# Patient Record
Sex: Female | Born: 1965 | Race: White | Hispanic: No | Marital: Single | Smoking: Current every day smoker
Health system: Southern US, Community
[De-identification: ages and names within clinical notes are randomized; demographics above are authoritative.]

## PROBLEM LIST (undated history)

## (undated) DIAGNOSIS — I509 Heart failure, unspecified: Secondary | ICD-10-CM

## (undated) HISTORY — PX: ABDOMINAL HYSTERECTOMY: SHX81

---

## 2002-05-20 ENCOUNTER — Emergency Department (HOSPITAL_COMMUNITY): Admission: EM | Admit: 2002-05-20 | Discharge: 2002-05-20 | Payer: Self-pay | Admitting: Emergency Medicine

## 2005-01-01 ENCOUNTER — Ambulatory Visit: Payer: Self-pay | Admitting: Family Medicine

## 2005-07-04 ENCOUNTER — Emergency Department: Payer: Self-pay | Admitting: Emergency Medicine

## 2005-10-03 ENCOUNTER — Emergency Department: Payer: Self-pay | Admitting: Emergency Medicine

## 2006-03-28 ENCOUNTER — Emergency Department: Payer: Self-pay | Admitting: Unknown Physician Specialty

## 2007-01-03 ENCOUNTER — Emergency Department: Payer: Self-pay | Admitting: Emergency Medicine

## 2007-02-01 ENCOUNTER — Emergency Department: Payer: Self-pay

## 2007-05-31 ENCOUNTER — Emergency Department: Payer: Self-pay | Admitting: Emergency Medicine

## 2007-07-14 ENCOUNTER — Emergency Department: Payer: Self-pay | Admitting: Emergency Medicine

## 2007-07-21 ENCOUNTER — Ambulatory Visit: Payer: Self-pay | Admitting: Family Medicine

## 2007-08-25 ENCOUNTER — Emergency Department: Payer: Self-pay | Admitting: Emergency Medicine

## 2008-02-16 ENCOUNTER — Emergency Department: Payer: Self-pay | Admitting: Emergency Medicine

## 2008-02-24 ENCOUNTER — Emergency Department (HOSPITAL_COMMUNITY): Admission: EM | Admit: 2008-02-24 | Discharge: 2008-02-24 | Payer: Self-pay | Admitting: Emergency Medicine

## 2008-08-31 ENCOUNTER — Emergency Department (HOSPITAL_COMMUNITY): Admission: EM | Admit: 2008-08-31 | Discharge: 2008-09-01 | Payer: Self-pay | Admitting: Emergency Medicine

## 2008-11-17 ENCOUNTER — Emergency Department (HOSPITAL_COMMUNITY): Admission: EM | Admit: 2008-11-17 | Discharge: 2008-11-17 | Payer: Self-pay | Admitting: Emergency Medicine

## 2008-12-24 ENCOUNTER — Inpatient Hospital Stay: Payer: Self-pay | Admitting: *Deleted

## 2009-01-01 ENCOUNTER — Inpatient Hospital Stay: Payer: Self-pay | Admitting: Psychiatry

## 2009-07-20 ENCOUNTER — Emergency Department: Payer: Self-pay | Admitting: Emergency Medicine

## 2009-07-23 ENCOUNTER — Emergency Department: Payer: Self-pay | Admitting: Emergency Medicine

## 2009-09-15 ENCOUNTER — Encounter
Admission: RE | Admit: 2009-09-15 | Discharge: 2009-09-15 | Payer: Self-pay | Admitting: Physical Medicine & Rehabilitation

## 2010-05-19 ENCOUNTER — Emergency Department: Payer: Self-pay | Admitting: Emergency Medicine

## 2010-07-22 ENCOUNTER — Emergency Department: Payer: Self-pay | Admitting: Emergency Medicine

## 2010-07-22 ENCOUNTER — Inpatient Hospital Stay (HOSPITAL_COMMUNITY)
Admission: AD | Admit: 2010-07-22 | Discharge: 2010-07-28 | DRG: 194 | Disposition: A | Discharge status: Home / Self Care | Payer: Medicaid Other | Source: Other Acute Inpatient Hospital | Attending: Internal Medicine | Admitting: Internal Medicine

## 2010-07-22 DIAGNOSIS — IMO0002 Reserved for concepts with insufficient information to code with codable children: Secondary | ICD-10-CM

## 2010-07-22 DIAGNOSIS — J189 Pneumonia, unspecified organism: Principal | ICD-10-CM | POA: Diagnosis present

## 2010-07-22 DIAGNOSIS — I509 Heart failure, unspecified: Secondary | ICD-10-CM | POA: Diagnosis present

## 2010-07-22 DIAGNOSIS — I10 Essential (primary) hypertension: Secondary | ICD-10-CM | POA: Diagnosis present

## 2010-07-22 DIAGNOSIS — R0602 Shortness of breath: Secondary | ICD-10-CM

## 2010-07-22 DIAGNOSIS — R079 Chest pain, unspecified: Secondary | ICD-10-CM

## 2010-07-22 DIAGNOSIS — B1920 Unspecified viral hepatitis C without hepatic coma: Secondary | ICD-10-CM | POA: Diagnosis present

## 2010-07-22 DIAGNOSIS — Z880 Allergy status to penicillin: Secondary | ICD-10-CM

## 2010-07-22 DIAGNOSIS — R0902 Hypoxemia: Secondary | ICD-10-CM | POA: Diagnosis present

## 2010-07-22 DIAGNOSIS — F411 Generalized anxiety disorder: Secondary | ICD-10-CM | POA: Diagnosis present

## 2010-07-22 DIAGNOSIS — M069 Rheumatoid arthritis, unspecified: Secondary | ICD-10-CM | POA: Diagnosis present

## 2010-07-22 DIAGNOSIS — Z7982 Long term (current) use of aspirin: Secondary | ICD-10-CM

## 2010-07-22 DIAGNOSIS — J441 Chronic obstructive pulmonary disease with (acute) exacerbation: Secondary | ICD-10-CM | POA: Diagnosis present

## 2010-07-22 DIAGNOSIS — Z23 Encounter for immunization: Secondary | ICD-10-CM

## 2010-07-22 DIAGNOSIS — IMO0001 Reserved for inherently not codable concepts without codable children: Secondary | ICD-10-CM | POA: Diagnosis present

## 2010-07-22 DIAGNOSIS — F17200 Nicotine dependence, unspecified, uncomplicated: Secondary | ICD-10-CM | POA: Diagnosis present

## 2010-07-22 HISTORY — DX: Heart failure, unspecified: I50.9

## 2010-07-22 LAB — CARDIAC PANEL(CRET KIN+CKTOT+MB+TROPI)
CK, MB: 3.1 ng/mL (ref 0.3–4.0)
Relative Index: INVALID (ref 0.0–2.5)
Total CK: 25 U/L (ref 7–177)
Troponin I: 0.27 ng/mL — ABNORMAL HIGH (ref 0.00–0.06)

## 2010-07-22 LAB — MRSA PCR SCREENING: MRSA by PCR: NEGATIVE

## 2010-07-23 DIAGNOSIS — I369 Nonrheumatic tricuspid valve disorder, unspecified: Secondary | ICD-10-CM

## 2010-07-23 DIAGNOSIS — R7989 Other specified abnormal findings of blood chemistry: Secondary | ICD-10-CM

## 2010-07-23 LAB — CBC
HCT: 35.6 % — ABNORMAL LOW (ref 36.0–46.0)
Hemoglobin: 12.2 g/dL (ref 12.0–15.0)
MCH: 31.7 pg (ref 26.0–34.0)
MCHC: 34.3 g/dL (ref 30.0–36.0)
MCV: 92.5 fL (ref 78.0–100.0)
Platelets: 403 10*3/uL — ABNORMAL HIGH (ref 150–400)
RBC: 3.85 MIL/uL — ABNORMAL LOW (ref 3.87–5.11)
RDW: 13.3 % (ref 11.5–15.5)
WBC: 22 10*3/uL — ABNORMAL HIGH (ref 4.0–10.5)

## 2010-07-23 LAB — BASIC METABOLIC PANEL
BUN: 41 mg/dL — ABNORMAL HIGH (ref 6–23)
CO2: 29 mEq/L (ref 19–32)
Calcium: 8.4 mg/dL (ref 8.4–10.5)
Chloride: 98 mEq/L (ref 96–112)
Creatinine, Ser: 0.78 mg/dL (ref 0.4–1.2)
GFR calc Af Amer: >60 mL/min (ref >60)
GFR calc non Af Amer: >60 mL/min (ref >60)
Glucose, Bld: 131 mg/dL — ABNORMAL HIGH (ref 70–99)
Potassium: 4.1 mEq/L (ref 3.5–5.1)
Sodium: 136 mEq/L (ref 135–145)

## 2010-07-23 LAB — CARDIAC PANEL(CRET KIN+CKTOT+MB+TROPI)
CK, MB: 2.8 ng/mL (ref 0.3–4.0)
Relative Index: INVALID (ref 0.0–2.5)
Total CK: 18 U/L (ref 7–177)
Troponin I: 0.26 ng/mL — ABNORMAL HIGH (ref 0.00–0.06)

## 2010-07-23 LAB — URINE DRUGS OF ABUSE SCREEN W ALC, ROUTINE (REF LAB)
Amphetamine Screen, Ur: NEGATIVE
Ethyl Alcohol: <10 mg/dL (ref <10)
Phencyclidine (PCP): NEGATIVE
Propoxyphene: NEGATIVE

## 2010-07-23 LAB — BRAIN NATRIURETIC PEPTIDE: Pro B Natriuretic peptide (BNP): 396 pg/mL — ABNORMAL HIGH (ref 0.0–100.0)

## 2010-07-23 LAB — LIPID PANEL: HDL: <10 mg/dL — ABNORMAL LOW (ref >39)

## 2010-07-23 NOTE — Consult Note (Signed)
Jaime Roberts, STLOUIS                  ACCOUNT NO.:  [id number]  MEDICAL RECORD NO.:  [id number]           PATIENT TYPE:  I  LOCATION:  2103                         FACILITY:  [facility]  PHYSICIAN:  Barron Alvine, MD        DATE OF BIRTH:  08/24/1965  DATE OF CONSULTATION:  07/22/2010 DATE OF DISCHARGE:                                CONSULTATION   CONSULTATION REQUESTING PHYSICIAN:  Trish Fountain, MD, for [facility].  REASON FOR CONSULTATION:  Shortness of breath and chest pain, concerning for congestive heart failure.  HISTORY OF PRESENT ILLNESS:  The patient is a 45 year old female with a history of ARDS 6 years ago status post hysterectomy, hypertension, hepatitis C, and rheumatoid arthritis as well as fibromyalgia who is transferred from [facility] for worsening chest pain.  Cardiology consultation is requested for the reason described above.  The patient is a very poor historian.  She states that she has had worsening shortness of breath in the past 5 days.  She also reports chest pain for about 1 week. The chest pain is midline in location with no significant radiation, usually 6 on a scale of 1-10 in intensity at maximum.   The chest pain usually lasts about 30 minutes before resolving spontaneously.  The chest pain can happen at rest.  There is no significant alleviating or aggravating factors knowing to the patient.  She reports that she has nausea and diaphoresis associated with the chest pain at times.  She takes Nexium which can relieve the chest pain to some extent.  She denies paroxysmal nocturnal dyspnea or orthopnea.  No palpitations, syncope, or presyncope.  The patient presented to [facility] initially.  She was found to have elevated BNP 19,986 with BUN of 54 and creatinine 1.88.  The troponin I was 0.34 (0.00-0.05).  CK and CK-MB are within normal limits. Chest x-ray showed that the pulmonary interstitium demonstrates increased density  bilaterally which may reflex edema of cardiac or noncardiac causes.  CHF is not excluded.  EKG showed sinus rhythm with heart rate of 86 beats per minute.  There is ST depression in leads I and aVL.  T-wave inversion is present in lead I, II, and V4- V6.  Q-waves are present in leads II and aVF.  No previous EKG is available for comparison.  PAST MEDICAL HISTORY: 1. ARDS 6 years ago status post hysterectomy. 2. Hypertension. 3. Rheumatoid arthritis. 4. Hepatitis C. 5. Bronchitis. 6. Fibromyalgia.  REVIEW OF SYSTEMS:  A 10-organ system review of systems unremarkable except as described above.  ALLERGIES:  PENICILLIN causing hives.  CURRENT MEDICATIONS: 1. Potassium chloride 10 mEq p.o. daily. 2. Zolpidem 10 mg p.o. daily. 3. Tramadol 50 mg p.o. 3 times daily as needed. 4. Promethazine 25 mg p.o. every 6 hours as needed. 5. ProAir inhaler 2 puffs every 4 hours as needed. 6. Prednisone 10 mg p.o. daily as needed. 7. Nexium 40 mg p.o. daily. 8. Naproxen/esomeprazole 500/20 mg p.o. daily. 9. Ketorolac 10 mg p.o. every 4 hours as needed. 10.Gabapentin 800 mg p.o. 3 times daily. 11.Furosemide 20 mg  p.o. daily. 12.Combivent 2 puffs 3 times daily as needed.  FAMILY HISTORY:  Mother with hypertension.  Father with diabetes and died of myocardial infarction at age 69.  SOCIAL HISTORY:  The patient smokes cigarettes 1-1/2 packs per day for approximately 5 years.  Occasional alcohol use.  She lives with her mother and the stepfather.  She is divorced, on disability.  The patient has a history of polysubstance abuse, marijuana many years ago.  The patient also used IV cocaine many years ago.  She denies current polysubstance use.  PHYSICAL EXAMINATION:  VITAL SIGNS:  Temperature 97.3, heart rate 84, blood pressure 107/85, and O2 sat 96% on nasal cannula. GENERAL:  The patient is in no acute distress. HEAD:  Atraumatic and normocephalic. EYES:  Pupils are equal, round, and reactive  to light bilaterally. NECK:  JVP estimated at 10 cm.  No thyromegaly or bruit. LUNGS:  With crepitation up to midlung zone, right greater than the left. HEART:  Regular rhythm and rate.  There is no murmur, rub, or gallop. There is no displacement of PMI. ABDOMEN:  Soft, nondistended, and nontender.  Normoactive bowel sounds are present.  No abnormality. EXTREMITIES:  No cyanosis, clubbing, or peripheral edema. NEURO:  The patient is alert, awake, and oriented x3.  However, the patient is somewhat slow in response to questions. SKIN:  No rash. MUSCULOSKELETAL:  No joint swelling.  LABORATORY DATA FROM OUTSIDE HOSPITAL: 1. EKG as described above in H and P. 2. CBC:  White blood cell 27.1, hemoglobin 5.7, hematocrit 36.3, and     platelet of 466. 3. Chemistry:  Glucose 122, BUN 54, creatinine 1.88, sodium 134,     potassium 4.6, and chloride 96. 4. Cardiac Markers:  CK 99, CK-MB 2.6.  Troponin I elevated at 0.34. 5. INR 1.4. 6. BNP 19,986. 7. Chest x-ray as described above.  ASSESSMENT AND PLAN:  In summary, the patient is a 45 year old female with a history of acute respiratory distress syndrome 6 years ago, hypertension, hepatitis C, rheumatoid arthritis, bronchitis, and fibromyalgia, no history of coronary artery disease reported, who is transferred from [facility] for worsening shortness of breath and chest pain.  1. Chest pain/shortness of breath:  The patient has mildly elevated     troponin I with normal CK-MB.  We will cycle the cardiac markers     for trending.  If the cardiac markers are trending up, the patient     may need cardiac catheterization if the symptom persists. We will obtain an echocardiography to evaluate the heart function as well as the potential structural abnormality. The patient has clinically CHF.  We agree with diuresis and holding the  nonsteroidal antiinflammatory drugs. 2. History of hypertension, under control. 3. Further  investigation/management, pending the above tests.  Thank you for the consultation.          ______________________________ Barron Alvine, MD     RZ/MEDQ  D:  07/22/2010  T:  07/23/2010  Job:  [id number]  Electronically Signed by Barron Alvine MD on 07/23/2010 09:43:56 PM

## 2010-07-24 ENCOUNTER — Inpatient Hospital Stay (HOSPITAL_COMMUNITY): Payer: Medicaid Other

## 2010-07-24 LAB — CBC
HCT: 40.5 % (ref 36.0–46.0)
Hemoglobin: 13.7 g/dL (ref 12.0–15.0)
MCV: 93.1 fL (ref 78.0–100.0)
RBC: 4.35 MIL/uL (ref 3.87–5.11)
WBC: 12.8 10*3/uL — ABNORMAL HIGH (ref 4.0–10.5)

## 2010-07-24 LAB — BLOOD GAS, ARTERIAL
Bicarbonate: 29.2 mEq/L — ABNORMAL HIGH (ref 20.0–24.0)
Patient temperature: 99.9
TCO2: 30.5 mmol/L (ref 0–100)
pCO2 arterial: 42.8 mmHg (ref 35.0–45.0)
pH, Arterial: 7.453 — ABNORMAL HIGH (ref 7.350–7.400)
pO2, Arterial: 68.3 mmHg — ABNORMAL LOW (ref 80.0–100.0)

## 2010-07-24 LAB — BASIC METABOLIC PANEL
BUN: 16 mg/dL (ref 6–23)
Chloride: 97 mEq/L (ref 96–112)
Glucose, Bld: 148 mg/dL — ABNORMAL HIGH (ref 70–99)
Potassium: 3.7 mEq/L (ref 3.5–5.1)

## 2010-07-24 LAB — IRON AND TIBC
Saturation Ratios: 10 % — ABNORMAL LOW (ref 20–55)
TIBC: 284 ug/dL (ref 250–470)
UIBC: 257 ug/dL

## 2010-07-24 LAB — DIFFERENTIAL
Lymphocytes Relative: 9 % — ABNORMAL LOW (ref 12–46)
Lymphs Abs: 1.1 10*3/uL (ref 0.7–4.0)
Neutro Abs: 11.2 10*3/uL — ABNORMAL HIGH (ref 1.7–7.7)
Neutrophils Relative %: 88 % — ABNORMAL HIGH (ref 43–77)

## 2010-07-24 LAB — FOLATE: Folate: 4.1 ng/mL

## 2010-07-24 LAB — VITAMIN B12: Vitamin B-12: 508 pg/mL (ref 211–911)

## 2010-07-24 LAB — GLUCOSE, CAPILLARY

## 2010-07-24 NOTE — H&P (Signed)
Jaime Roberts, Jaime Roberts                  ACCOUNT NO.:  [id number]  MEDICAL RECORD NO.:  [id number]           PATIENT TYPE:  I  LOCATION:  2103                         FACILITY:  [facility]  PHYSICIAN:  Gordy Savers, MDDATE OF BIRTH:  March 14, 1966  DATE OF ADMISSION:  07/22/2010 DATE OF DISCHARGE:                             HISTORY & PHYSICAL   CHIEF COMPLAINT:  Lethargy.  HISTORY OF PRESENT ILLNESS:  The patient is a 45 year old Caucasian female who was accepted in transfer from [facility] for further treatment of suspected pneumonia and hypoxemia.  The patient has been ill for 3 or 4 days.  Her mother has noted some swelling especially some increasing abdominal girth.  There has been some difficulty with sleeping possibly due to dyspnea, but also back pain has been a factor.  She states that this is also a chronic complaint.  There has been poor appetite and over the past 1 or 2 days, there has been some fever, sweats and possibly chills.  On the day of admission, she became more short of breath and was found by her mother to be quite lethargic and was transported to [facility] recent [facility] for evaluation via ambulance.  Evaluation included a chest x-ray that revealed mild cardiomegaly and interstitial edematous- type changes.  This was consistent with infection or edema.  BNP was elevated at almost 20,000, creatinine was 1.88 and the patient had a leukocytosis of 27.1, troponin was elevated at 0.34.  INR 1.4.  Arterial blood gas on a 100% nonrebreathing mask revealed a pH of 7.37, pCO2 of 42 and a pO2 of 90.  The patient received a modest dose of furosemide 10 mg IM.  Lactic acid level was normal at 1.3.  The head CT was obtained that was negative.  Electrocardiogram revealed evidence of a prior inferior wall MI of indeterminate age.  There is atrial large and diffuse inferolateral ST-T wave changes.  The patient is now admitted for further  evaluation and management of suspected congestive heart failure in the setting of a probable recent MI.  PAST MEDICAL HISTORY:  The patient had chest x-ray performed at this facility in January 2004, this did reveal slight increased cardiac size at that time and bronchitic changes only.  She has a 10-year history of rheumatoid arthritis and has been on prednisone for the past 7 years at a 10 mg daily dose.  She has fibromyalgia.  She states that approximately 8-10 years ago she was hospitalized in [place] for ARDS with complicated by wound infection following a hysterectomy.  She was hospitalized for 27 days.  ALLERGIES:  PENICILLIN and DARVOCET.  MEDICAL REGIMEN: 1. ProAir metered-dose inhaler. 2. Soma 350 t.i.d. p.r.n. 3. Fioricet every 6 hours p.r.n. 4. Alprazolam 2 mg t.i.d. 5. Potassium chloride 10 mEq daily. 6. Furosemide 20 mg daily p.r.n. 7. Ambien 10 mg at bedtime p.r.n. 8. Tramadol 50 mg t.i.d. p.r.n. 9. Phenergan 25 mg every 6 hours p.r.n. 10.As mentioned, prednisone 10 mg daily. 11.Nexium 40 mg daily. 12.Naproxen daily. 13.Gabapentin 800 mg t.i.d.  SOCIAL HISTORY:  She has been  disabled for the past 7 years due to her rheumatoid arthritis.  FAMILY HISTORY:  Mother has a history of COPD, hypertension and is status post abdominal aortic aneurysm repair.  Father died of 75 due to complications of heart failure.  He also had a history of diabetes.  She is a two-pack per day smoker.  She is gravida 1, para 0, abortus 1.  PHYSICAL EXAMINATION:  VITAL SIGNS:  Blood pressure 110/70, pulse rate 70, O2 saturation 95 on 2 liters of oxygen per minute. GENERAL:  Revealed the patient to be awake, appeared to be slightly slow and lethargic.  She had mild diaphoresis. HEENT:  Revealed normal pupil responses.  Conjunctivae clear.  ENT unremarkable. NECK:  Suggested prominent neck vein distention on the right, but not to apparent on the left.  No bruits appreciated. CHEST:   Revealed rales involve in the lower two thirds hemithoraces. CARDIOVASCULAR:  Revealed a slow regular rhythm without gallop or murmur. ABDOMEN:  Obese, soft, and nontender.  Slightly tender liver edge.  No bruits appreciated.  A surgical scar noted in the lower midline. EXTREMITIES:  Revealed +1 edema.  The left posterior tibial pulse was absent.  Posterior tibial pulses were faint.  The right dorsalis pedis pulse was normal. NEUROLOGIC:  The patient was slow, but responsive.  She was able to move all extremities without difficulty.  IMPRESSION: 1. Congestive heart failure. 2. Probable recent acute myocardial infarction.  ADDITIONAL DIAGNOSES: 1. Rheumatoid arthritis. 2. Chronic prednisone therapy. 3. Tobacco abuse. 4. Anxiety disorder. 5. Fibromyalgia. 6. Rule out chronic kidney disease, possibly impaired glucose     tolerance.  DISPOSITION:  The patient will be admitted to the medical ICU.  She will be started on careful diuresis.  The follow chest x-ray will be maintained, will be checked in the morning.  She will be maintained on nasal oxygen to maintain adequate saturations.  Foley catheter will be maintained.  The patient will have daily weights and careful I and O's.  She will be treated with DVT prophylaxis.  CBC and electrolytes will be monitored in the morning.  Additional cardiac enzymes will be cycled.  The patient will have a 2-D echocardiogram performed.     Gordy Savers, MD     PFK/MEDQ  D:  07/22/2010  T:  07/23/2010  Job:  [id number]  Electronically Signed by Eleonore Chiquito MD on 07/24/2010 08:51:19 AM

## 2010-07-25 ENCOUNTER — Encounter (HOSPITAL_COMMUNITY): Payer: Self-pay | Admitting: Radiology

## 2010-07-25 ENCOUNTER — Inpatient Hospital Stay (HOSPITAL_COMMUNITY): Payer: Medicaid Other

## 2010-07-25 DIAGNOSIS — R0602 Shortness of breath: Secondary | ICD-10-CM

## 2010-07-25 LAB — GLUCOSE, CAPILLARY
Glucose-Capillary: 107 mg/dL — ABNORMAL HIGH (ref 70–99)
Glucose-Capillary: 102 mg/dL — ABNORMAL HIGH (ref 70–99)

## 2010-07-25 LAB — D-DIMER, QUANTITATIVE: D-Dimer, Quant: 1.14 ug/mL-FEU — ABNORMAL HIGH (ref 0.00–0.48)

## 2010-07-25 MED ORDER — IOHEXOL 300 MG/ML  SOLN
100.0000 mL | Freq: Once | INTRAMUSCULAR | Status: AC | PRN
  Administered 2010-07-25: 100 mL via INTRAVENOUS

## 2010-07-25 MED ORDER — TECHNETIUM TC 99M TETROFOSMIN IV KIT
10.0000 | PACK | Freq: Once | INTRAVENOUS | Status: AC | PRN
  Administered 2010-07-25: 10 via INTRAVENOUS

## 2010-07-25 MED ORDER — TECHNETIUM TC 99M TETROFOSMIN IV KIT
30.0000 | PACK | Freq: Once | INTRAVENOUS | Status: AC | PRN
  Administered 2010-07-25: 30 via INTRAVENOUS

## 2010-07-26 ENCOUNTER — Other Ambulatory Visit (HOSPITAL_COMMUNITY): Payer: Medicaid Other

## 2010-07-26 DIAGNOSIS — J449 Chronic obstructive pulmonary disease, unspecified: Secondary | ICD-10-CM

## 2010-07-26 DIAGNOSIS — J9600 Acute respiratory failure, unspecified whether with hypoxia or hypercapnia: Secondary | ICD-10-CM

## 2010-07-26 DIAGNOSIS — J84114 Acute interstitial pneumonitis: Secondary | ICD-10-CM

## 2010-07-26 DIAGNOSIS — J4489 Other specified chronic obstructive pulmonary disease: Secondary | ICD-10-CM

## 2010-07-26 LAB — BRAIN NATRIURETIC PEPTIDE: Pro B Natriuretic peptide (BNP): 65 pg/mL (ref 0.0–100.0)

## 2010-07-26 LAB — BARBITURATE, URINE, CONFIRMATION: Phenobarbital GC/MS Conf: NEGATIVE

## 2010-07-26 LAB — BENZODIAZEPINE, QUANTITATIVE, URINE
Alprazolam (GC/LC/MS), ur confirm: NEGATIVE NG/ML
Nordiazepam GC/MS Conf: NEGATIVE NG/ML
Oxazepam GC/MS Conf: NEGATIVE NG/ML
Temazepam GC/MS Conf: NEGATIVE NG/ML

## 2010-07-26 LAB — SEDIMENTATION RATE: Sed Rate: 50 mm/hr — ABNORMAL HIGH (ref 0–22)

## 2010-07-26 LAB — PROCALCITONIN: Procalcitonin: 0.29 ng/mL

## 2010-07-27 ENCOUNTER — Inpatient Hospital Stay (HOSPITAL_COMMUNITY): Payer: Medicaid Other

## 2010-07-27 LAB — COMPREHENSIVE METABOLIC PANEL
ALT: 27 U/L (ref 0–35)
AST: 25 U/L (ref 0–37)
CO2: 28 mEq/L (ref 19–32)
Calcium: 8.3 mg/dL — ABNORMAL LOW (ref 8.4–10.5)
Creatinine, Ser: 0.57 mg/dL (ref 0.4–1.2)
GFR calc Af Amer: >60 mL/min (ref >60)
GFR calc non Af Amer: >60 mL/min (ref >60)
Sodium: 140 mEq/L (ref 135–145)
Total Protein: 6 g/dL (ref 6.0–8.3)

## 2010-07-27 LAB — DIFFERENTIAL
Basophils Absolute: 0 10*3/uL (ref 0.0–0.1)
Eosinophils Absolute: 0.1 10*3/uL (ref 0.0–0.7)
Lymphs Abs: 2.4 10*3/uL (ref 0.7–4.0)
Monocytes Absolute: 0.7 10*3/uL (ref 0.1–1.0)
Neutro Abs: 11.7 10*3/uL — ABNORMAL HIGH (ref 1.7–7.7)

## 2010-07-27 LAB — CBC
Hemoglobin: 13.4 g/dL (ref 12.0–15.0)
MCH: 31 pg (ref 26.0–34.0)
MCHC: 33.1 g/dL (ref 30.0–36.0)
RDW: 13.5 % (ref 11.5–15.5)

## 2010-07-28 LAB — COMPREHENSIVE METABOLIC PANEL
Alkaline Phosphatase: 90 U/L (ref 39–117)
BUN: 10 mg/dL (ref 6–23)
Chloride: 103 mEq/L (ref 96–112)
Creatinine, Ser: 0.65 mg/dL (ref 0.4–1.2)
GFR calc non Af Amer: >60 mL/min (ref >60)
Glucose, Bld: 130 mg/dL — ABNORMAL HIGH (ref 70–99)
Potassium: 3.6 mEq/L (ref 3.5–5.1)
Total Bilirubin: 0.4 mg/dL (ref 0.3–1.2)

## 2010-07-28 LAB — CBC
HCT: 39.5 % (ref 36.0–46.0)
MCH: 30.3 pg (ref 26.0–34.0)
MCV: 93.6 fL (ref 78.0–100.0)
RBC: 4.22 MIL/uL (ref 3.87–5.11)
WBC: 13.1 10*3/uL — ABNORMAL HIGH (ref 4.0–10.5)

## 2010-07-28 LAB — DIFFERENTIAL
Eosinophils Relative: 2 % (ref 0–5)
Lymphocytes Relative: 26 % (ref 12–46)
Lymphs Abs: 3.4 10*3/uL (ref 0.7–4.0)
Monocytes Relative: 7 % (ref 3–12)
Neutrophils Relative %: 65 % (ref 43–77)

## 2010-07-31 ENCOUNTER — Telehealth: Payer: Self-pay | Admitting: Critical Care Medicine

## 2010-07-31 NOTE — Telephone Encounter (Signed)
Called and spoke with pt and she needed a hfu in 2 weeks with Dr. Delford Field. He had nothing available so pt is scheduled to see TP 08/16/10 at 3:00 [place], [place]

## 2010-07-31 NOTE — Discharge Summary (Signed)
Jaime Roberts, Jaime Roberts                  ACCOUNT NO.:  [id number]  MEDICAL RECORD NO.:  [id number]           PATIENT TYPE:  I  LOCATION:  5525                         FACILITY:  [facility]  PHYSICIAN:  Pincus Large, MD     DATE OF BIRTH:  1965/06/15  DATE OF ADMISSION:  07/22/2010 DATE OF DISCHARGE:  07/28/2010                              DISCHARGE SUMMARY   TIME SPENT ON DISCHARGE SUMMARY:  35 minutes.  REASON FOR ADMISSION:  Lethargy and shortness of breath.  DISCHARGE DIAGNOSES: 1. Diffuse pulmonary infiltrate. 2. Bilateral pneumonia. 3. Chronic obstructive pulmonary disease exacerbation. 4. History of rheumatoid arthritis. 5. History of tobacco abuse. 6. History of anxiety.  DISCHARGE MEDICATION: 1. Levaquin 750 mg p.o. daily x7 days. 2. Aspirin 81 mg daily. 3. Xanax 1 mg t.i.d. 4. Oxycodone 5 mg IR q.4 p.r.n. 5. Combivent inhaler 1 puff b.i.d. 6. Lasix 20 mg daily. 7. Gabapentin 800 mg t.i.d. 8. Ketorolac 10 mg q.4 p.r.n. 9. Naproxen/esomeprazole 500/20 mg daily. 10.Nexium 40 mg daily. 11.Potassium chloride 10 mEq daily. 12.Medrol Pak tapering dose. 13.Prednisone 10 mg daily. 14.ProAir 2 puffs q.4. 15.Tramadol 50 mg q.6 p.r.n. 16.Ambien 10 mg at bedtime p.r.n.  IMAGING: 1. Chest x-ray was done on July 19, 2010 which shows mild-to-moderate     bronchitic changes. 2. Chest x-ray on July 24, 2010, mild-to-moderate bronchitic changes. 3. Nuclear medicine stress test which shows normal myocardial     perfusion study with left ventricular ejection fraction likely     estimated at 81%. 4. CT angio of the chest was done on July 25, 2010 which shows no PE,     cardiomegaly, diffuse hazy bilateral pulmonary infiltrate. 5. Chest x-ray was done on July 27, 2010 with mildly improved     bilateral pneumonia.  BRIEF HOSPITAL COURSE:  This is a 45 year old female patient who was admitted on July 24, 2010 with 3-4 days history of progressive shortness of breath, marked  pleuritic type chest discomfort, productive cough with thick brown sputum/subjective fever and chills.  The patient was following prodromal for approximately 3 weeks of sore scratchy throat, nonproductive, and she felt to have allergic type symptoms after recently moving to her mother's house.  The patient came to the emergency room.  Her BNP was 396.  Because of this, Medical Service admitted to the hospital and she was seen by [facility] and she underwent a cardiac Myoview which was negative for any evidence of ischemia.  The patient was started on IV steroid and IV Levaquin.  At the beginning, the patient required 3 L of oxygen which tapered down. The patient now is saturating on room air 96%.  CONSULTATIONS: 1. [facility] 2. [facility].  LABORATORY DATA:  WBC on admission was 22,000 and came down to 13.1. Her sodium was 139, potassium 3.6, chloride 103, bicarb is 31, and glucose 130.  Urine drug screen was positive for barbiturate and benzo.  DISCHARGE PHYSICAL EXAMINATION:  VITAL SIGNS:  Temperature is 97, pulse is 95, respirations 18, blood pressure is 108/71, and she is saturating 96% on room air. GENERAL:  She is  awake, oriented x3, likes walking on the hallway. LUNGS:  Bilateral air entry.  No wheezes or crackles. ABDOMEN:  Soft.  Bowel sound is present. HEART:  S1-S2.  No murmur, no gallop, or rub. EXTREMITIES:  No cyanosis or edema.  HOSPITAL COURSE BY PROBLEM: 1. Bilateral pulmonary infiltrate/viral pneumonitis/hypersensitivity     pneumonitis.  The patient did improve with steroids and IV     antibiotic.  We will continue tapering dose of steroid and I will     keep her on 10 mg steroid daily.  Also, the patient was discharged     on Levaquin p.o. x7 days.  She is going to follow up with Dr.     Shan Levans in 2 weeks to repeat the chest x-ray. 2. Rheumatoid arthritis.  We will continue with steroid for now. 3. COPD exacerbation.  The  patient will continue with bronchodilator.     She does not qualify for home oxygen.  Also, a smoking cessation     consult was done at the bedside. 4. Anxiety.  The patient was on Xanax p.r.n.  DIET:  2 grams sodium.  DISPOSITION:  Home.         ______________________________ Pincus Large, MD    SA/MEDQ  D:  07/28/2010  T:  07/29/2010  Job:  [id number]  cc:   Charlcie Cradle. Delford Field, MD, [facility]  Electronically Signed by Pincus Large MD on 07/31/2010 03:53:12 PM

## 2010-08-03 NOTE — Consult Note (Signed)
Jaime Roberts, Jaime Roberts                  ACCOUNT NO.:  [id number]  MEDICAL RECORD NO.:  [id number]           PATIENT TYPE:  I  LOCATION:  2023                         FACILITY:  [facility]  PHYSICIAN:  Oretha Milch, MD      DATE OF BIRTH:  1965-11-12  DATE OF CONSULTATION:  07/26/2010 DATE OF DISCHARGE:                                CONSULTATION   CONSULTING PHYSICIAN:  Brendia Sacks, MD, with [facility].  REASON FOR CONSULTATION:  Abnormal CT scan, diffuse pulmonary infiltrates.  HISTORY OF PRESENT ILLNESS:  This is a 45 year old female patient who was admitted on July 24, 2010, with a 3- to 4-day history of progressive shortness of breath, marked pleuritic-type chest discomfort, productive cough, with thick brown sputum, subjective fever, and chills. This was all following a prodrome of approximately 3 weeks of sore scratchy throat, nonproductive cough, and what she felt to be allergic- type symptoms after recently moving into her mother's house.  Of note, she began to really note these symptoms following some cleaning which included exposures to carpet dust, and possibly and damp moldy material within the household as well.  Ultimately, she was admitted for further evaluation to [facility].  Diagnostic evaluation on presentation demonstrated mild troponin elevation, a BNP of 396, and because of this Medical Services asked for further cardiac consultation.  She was seen by [facility], she underwent a cardiac Myoview which was negative for evidence of ischemia, ultimately she was felt to have no acute cardiac issues.  Her initial chest x-ray demonstrated bilateral pulmonary infiltrates, which were initially felt to possibly reflect pulmonary edema.  She was treated in the hospital setting with IV Levaquin, gentle diuresis, IV steroids, given the concern for acute exacerbation of chronic obstructive pulmonary disease, and supplemental oxygen.  Of note, on initial  presentation, she required high-flow oxygen delivery via mask, since hospital admit she is now down to 3 L, and continues to feel clinically improved.  A CT of the chest was obtained on July 25, 2010, this demonstrated diffuse pulmonary infiltrates, markedly out of proportion to the vague systems.  Because of this, pulmonary consultation was requested.  PAST MEDICAL HISTORY: 1. Rheumatoid arthritis. 2. Hepatitis C. 3. Hypertension. 4. Chronic bronchitis. 5. Fibromyalgia. 6. Prior ARDS following hysterectomy about 6 years ago.  SOCIAL HISTORY:  She is a smoker, smokes about one and a half pack per day.  Does endorse occasional alcohol consumption, she has a history of polysubstance abuse, however, denies this currently, and her UDS was negative for illegal substances.  She is disabled, recently moved in with her mother and stepfather.  She notes that the house was in a poor shape, this has required deep cleaning, she does note dust exposure, fumes from the heating system exposure, and questioned and concerned about the house smelling like mold as well.  FAMILY HISTORY:  Positive for coronary artery disease, diabetes, and hypertension.  ALLERGIES:  PENICILLIN.  HOME MEDICATIONS:  K-Dur, Ambien, tramadol, promethazine, ProAir, prednisone, Nexium, naproxen, Toradol, Neurontin, furosemide, and Combivent.  REVIEW OF SYSTEMS:  GENERAL:  This is a 45 year old female,  currently denies fever, chills, aches, or pains, she is in no acute distress and overall feels markedly improved since hospital admit.  HEENT:  She has had some mild nasal discharge since admit, she attributes this to supplemental oxygen, otherwise prior to this did endorse some nasal discharge, nasal stuffiness, sore scratchy throat either prior to the above presenting symptoms.  PULMONARY/CARDIAC:  Reports her dyspnea to be improved, and reports her cough to be improved, it is still productive of yellow-tinged  secretions, however, she feels is clearing. She reports her chest discomfort has improved, she denies wheezing, denies palpitations, denies lower extremity swelling.  ABDOMEN: Nontender, denies nausea, vomiting, and bowel changes.  GU:  Within normal.  NEUROLOGIC:  Within normal.  ENDOCRINE:  Within normal.  OBJECTIVE:  VITAL SIGNS:  Temperature 97.6, heart rate 80-89, blood pressure 122/81, respirations 18-20, saturation 93% now on 3 L nasal prong. GENERAL:  This is a 45 year old female patient currently in no acute distress. HEENT:  Phonation is hoarse.  She has no JVD or adenopathy. PULMONARY:  Notable for bibasilar rales.  Breath sounds are equal, air entry is equal, and nonlabored. CARDIAC:  Regular rate and rhythm. ABDOMEN:  Soft, nontender.  Bowel sounds are positive, no organomegaly. GU:  Voids spontaneously. NEUROLOGIC:  Grossly intact without deficits.  LABORATORY/DIAGNOSTIC DATA:  D-dimer is 1.14.  BNP is 230.  Urine drug screen positive for barbiturates and benzodiazepines only.  Sodium 135, potassium 3.7, chloride 97, CO2 30, BUN 16, creatinine 0.58, glucose 145.  White blood cell count 12.8, hemoglobin 13.7, platelet count 441,000.  CT chest demonstrates bilateral diffuse pulmonary infiltrates.  IMPRESSION AND PLAN:  Diffuse pulmonary infiltrates of uncertain etiology.  Differential diagnosis includes pneumonia, viral pneumonitis, hypersensitivity pneumonitis, or flare of rheumatoid-related lung disease.  She has had a history of adult respiratory distress syndrome in the past, however, suspect that given her clinical improvement doubt that what we are saying here is progression of this process.  PLAN:  We will procalcitonin, check BNP, continue current antibiotics. Currently, she is on steroids.  At this point, it is unclear as to whether or not she has improved from antibiotics, or steroids as she has been treated with both.  Likely given her steroid exposure now  for over 48 hours, there would be little benefit from lung biopsy.  At this point, Dr. Vassie Loll will evaluate, likely will continue empiric course of antibiotics, continue slow steroid taper with followup radiographic evaluation in the outpatient setting to help dictate and guide steroid titration.  May ultimately require bronchoscopy and/or open lung biopsy in the future, however, yield may be minimal currently given her high dose of steroids.  Thank you for the opportunity to see Ms. Tasia Catchings.  We will continue to follow.     Zenia Resides, NP   ______________________________ Oretha Milch, MD    PB/MEDQ  D:  07/26/2010  T:  07/27/2010  Job:  [id number]  Electronically Signed by Zenia Resides NP on 07/30/2010 10:12:24 PM Electronically Signed by Cyril Mourning MD on 08/03/2010 02:27:20 PM

## 2010-08-16 ENCOUNTER — Inpatient Hospital Stay: Payer: Medicaid Other | Admitting: Adult Health

## 2010-11-08 ENCOUNTER — Ambulatory Visit: Payer: Self-pay | Admitting: Pain Medicine

## 2010-12-24 ENCOUNTER — Emergency Department: Payer: Self-pay | Admitting: Emergency Medicine

## 2011-02-16 ENCOUNTER — Emergency Department: Payer: Self-pay | Admitting: Emergency Medicine

## 2011-02-20 ENCOUNTER — Emergency Department: Payer: Self-pay | Admitting: *Deleted

## 2011-03-02 ENCOUNTER — Emergency Department: Payer: Self-pay | Admitting: Unknown Physician Specialty

## 2011-08-29 ENCOUNTER — Emergency Department: Payer: Self-pay | Admitting: Emergency Medicine

## 2011-08-30 LAB — CBC
HGB: 13 g/dL (ref 12.0–16.0)
MCHC: 32.4 g/dL (ref 32.0–36.0)
MCV: 91 fL (ref 80–100)
Platelet: 313 10*3/uL (ref 150–440)

## 2011-08-30 LAB — ETHANOL: Ethanol: <3 mg/dL

## 2011-08-30 LAB — URINALYSIS, COMPLETE
Bacteria: NONE SEEN
Ketone: NEGATIVE
Leukocyte Esterase: NEGATIVE
Nitrite: NEGATIVE
RBC,UR: 1 /HPF (ref 0–5)
Squamous Epithelial: <1

## 2011-08-30 LAB — DRUG SCREEN, URINE
Amphetamines, Ur Screen: NEGATIVE (ref ?–1000)
Cocaine Metabolite,Ur ~~LOC~~: NEGATIVE (ref ?–300)
MDMA (Ecstasy)Ur Screen: POSITIVE (ref ?–500)

## 2011-08-30 LAB — COMPREHENSIVE METABOLIC PANEL
Albumin: 3.2 g/dL — ABNORMAL LOW (ref 3.4–5.0)
BUN: 13 mg/dL (ref 7–18)
Bilirubin,Total: 0.3 mg/dL (ref 0.2–1.0)
Chloride: 104 mmol/L (ref 98–107)
Co2: 28 mmol/L (ref 21–32)
Osmolality: 279 (ref 275–301)
Potassium: 3.5 mmol/L (ref 3.5–5.1)
SGOT(AST): 27 U/L (ref 15–37)
Sodium: 140 mmol/L (ref 136–145)

## 2011-08-30 LAB — CK: CK, Total: 124 U/L (ref 21–215)

## 2011-08-30 LAB — SALICYLATE LEVEL: Salicylates, Serum: 2.5 mg/dL

## 2011-08-30 LAB — TSH: Thyroid Stimulating Horm: 0.48 u[IU]/mL

## 2011-09-02 ENCOUNTER — Emergency Department: Payer: Self-pay | Admitting: Emergency Medicine

## 2011-09-02 LAB — COMPREHENSIVE METABOLIC PANEL
Alkaline Phosphatase: 88 U/L (ref 50–136)
Anion Gap: 9 (ref 7–16)
BUN: 12 mg/dL (ref 7–18)
Bilirubin,Total: 0.7 mg/dL (ref 0.2–1.0)
Creatinine: 0.41 mg/dL — ABNORMAL LOW (ref 0.60–1.30)
EGFR (African American): >60
Glucose: 103 mg/dL — ABNORMAL HIGH (ref 65–99)
Osmolality: 265 (ref 275–301)
Potassium: 4.4 mmol/L (ref 3.5–5.1)
SGOT(AST): 31 U/L (ref 15–37)

## 2011-09-02 LAB — CBC
HGB: 13.7 g/dL (ref 12.0–16.0)
MCH: 29.8 pg (ref 26.0–34.0)
MCHC: 32.7 g/dL (ref 32.0–36.0)
MCV: 91 fL (ref 80–100)
RBC: 4.58 10*6/uL (ref 3.80–5.20)
RDW: 13.4 % (ref 11.5–14.5)
WBC: 13.2 10*3/uL — ABNORMAL HIGH (ref 3.6–11.0)

## 2011-09-02 LAB — URINALYSIS, COMPLETE
Bacteria: NONE SEEN
Blood: NEGATIVE
Glucose,UR: NEGATIVE mg/dL (ref 0–75)
Ketone: NEGATIVE
Nitrite: NEGATIVE
Protein: NEGATIVE
RBC,UR: 5 /HPF (ref 0–5)
Specific Gravity: 1.019 (ref 1.003–1.030)
Squamous Epithelial: 3

## 2011-09-02 LAB — DRUG SCREEN, URINE
Amphetamines, Ur Screen: NEGATIVE (ref ?–1000)
Benzodiazepine, Ur Scrn: POSITIVE (ref ?–200)
Cocaine Metabolite,Ur ~~LOC~~: NEGATIVE (ref ?–300)
Methadone, Ur Screen: NEGATIVE (ref ?–300)

## 2011-09-02 LAB — TSH: Thyroid Stimulating Horm: 0.22 u[IU]/mL — ABNORMAL LOW

## 2011-09-11 ENCOUNTER — Inpatient Hospital Stay: Payer: Self-pay | Admitting: Psychiatry

## 2011-09-11 LAB — COMPREHENSIVE METABOLIC PANEL
Albumin: 3.4 g/dL (ref 3.4–5.0)
Anion Gap: 7 (ref 7–16)
Bilirubin,Total: 0.3 mg/dL (ref 0.2–1.0)
EGFR (African American): >60
Potassium: 4 mmol/L (ref 3.5–5.1)
SGPT (ALT): 37 U/L

## 2011-09-11 LAB — DRUG SCREEN, URINE
Barbiturates, Ur Screen: POSITIVE (ref ?–200)
Cannabinoid 50 Ng, Ur ~~LOC~~: NEGATIVE (ref ?–50)
Cocaine Metabolite,Ur ~~LOC~~: NEGATIVE (ref ?–300)

## 2011-09-11 LAB — ETHANOL: Ethanol: <3 mg/dL

## 2011-09-11 LAB — CBC
MCH: 31.1 pg (ref 26.0–34.0)
MCHC: 34.3 g/dL (ref 32.0–36.0)
RBC: 4.3 10*6/uL (ref 3.80–5.20)
WBC: 13 10*3/uL — ABNORMAL HIGH (ref 3.6–11.0)

## 2011-09-11 LAB — ACETAMINOPHEN LEVEL: Acetaminophen: <2 ug/mL

## 2011-09-11 LAB — TSH: Thyroid Stimulating Horm: 0.94 u[IU]/mL

## 2011-09-12 LAB — BEHAVIORAL MEDICINE 1 PANEL
Albumin: 2.9 g/dL — ABNORMAL LOW (ref 3.4–5.0)
Alkaline Phosphatase: 104 U/L (ref 50–136)
Basophil #: 0.3 10*3/uL — ABNORMAL HIGH (ref 0.0–0.1)
EGFR (Non-African Amer.): >60
Eosinophil #: 0.3 10*3/uL (ref 0.0–0.7)
Glucose: 92 mg/dL (ref 65–99)
HGB: 13 g/dL (ref 12.0–16.0)
Lymphocyte %: 38.9 %
MCV: 91 fL (ref 80–100)
Monocyte #: 0.5 x10 3/mm (ref 0.2–0.9)
Osmolality: 273 (ref 275–301)
Potassium: 4.1 mmol/L (ref 3.5–5.1)
RDW: 14.1 % (ref 11.5–14.5)
Sodium: 138 mmol/L (ref 136–145)
Thyroid Stimulating Horm: 1.64 u[IU]/mL
Total Protein: 6.6 g/dL (ref 6.4–8.2)
WBC: 6.4 10*3/uL (ref 3.6–11.0)

## 2011-09-12 LAB — URINALYSIS, COMPLETE
Bilirubin,UR: NEGATIVE
Blood: NEGATIVE
Ketone: NEGATIVE
Protein: NEGATIVE
RBC,UR: <1 /HPF (ref 0–5)

## 2011-12-25 ENCOUNTER — Emergency Department: Payer: Self-pay | Admitting: Emergency Medicine

## 2012-05-08 IMAGING — CT CT HEAD WITHOUT CONTRAST
2 series · 15 of 30 positions shown, 19 images · non-contrast
Comparison: none

REASON FOR EXAM: unwitnessed fall, altered mental status
COMMENTS:

PROCEDURE:     CT  - CT HEAD WITHOUT CONTRAST  - September 11, 2011  [DATE]
RESULT:     Comparison:  08/30/2011, 02/20/2011
TECHNIQUE: Multiple axial images from the foramen magnum to the vertex were
obtained without IV contrast.

[Series 2: without · axial · non-contrast · 0.42mm/px · z∈[-152,-22]mm · 13 of 45 slices shown, 17 images]
[im 4/45  brain]
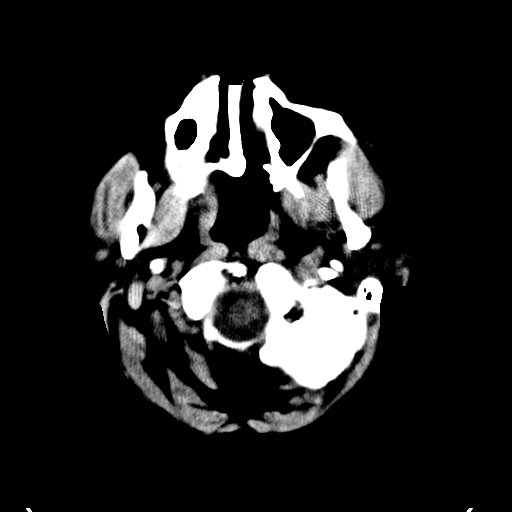
[im 4/45  bone]
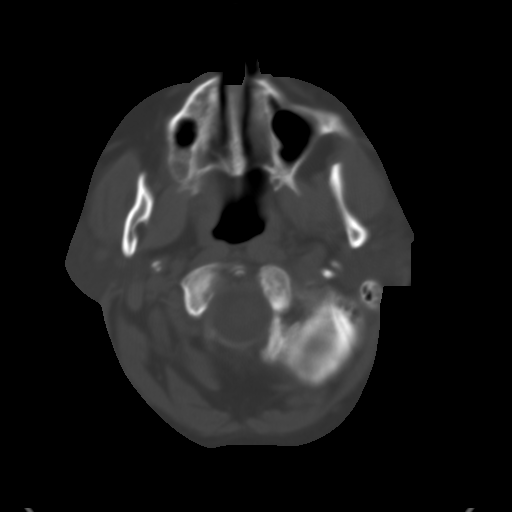
[im 7/45  brain]
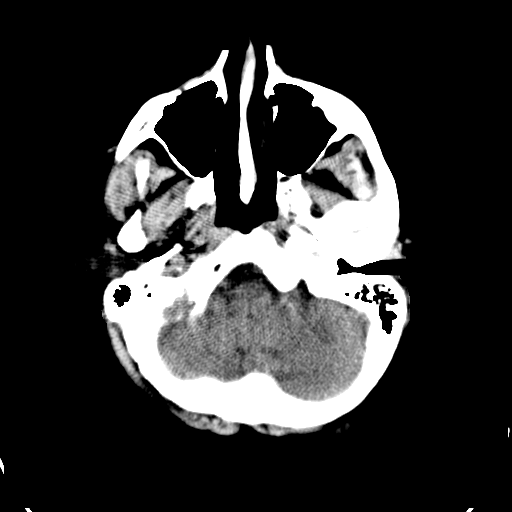
[im 10/45  brain]
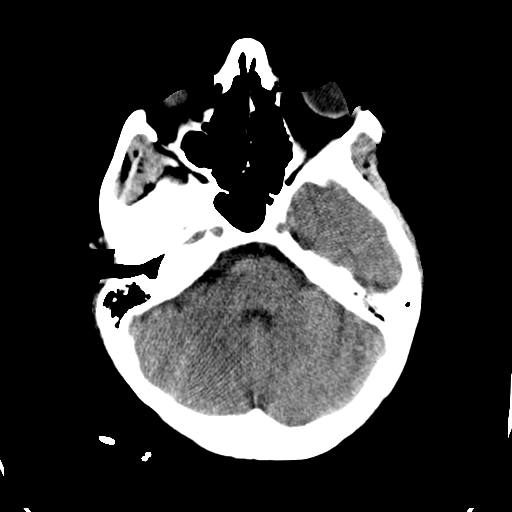
[im 13/45  brain]
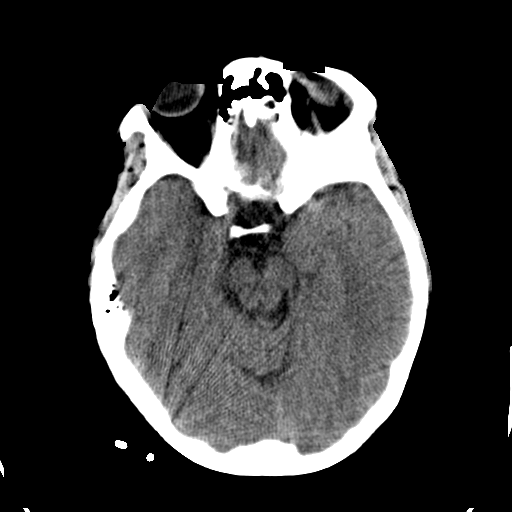
[im 16/45  brain]
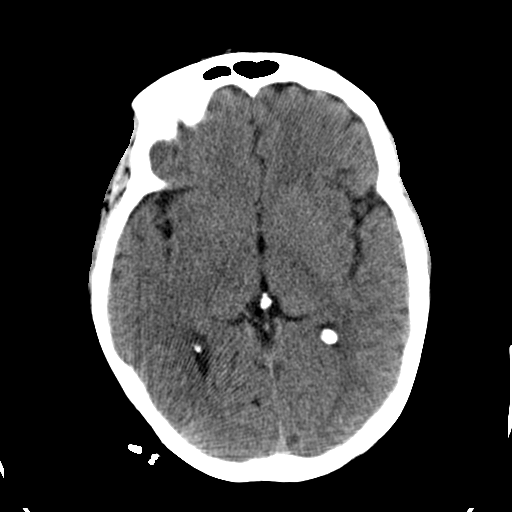
[im 16/45  bone]
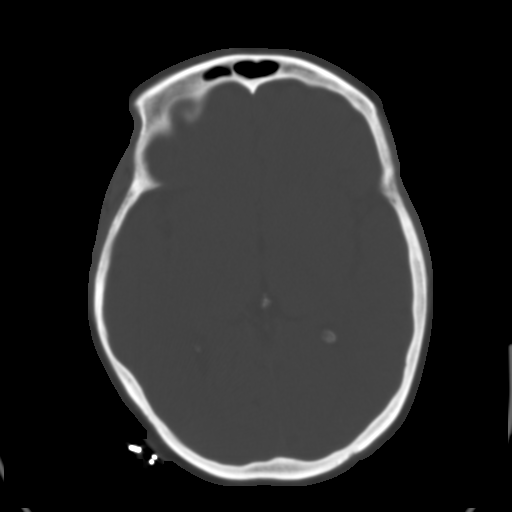
[im 19/45  brain]
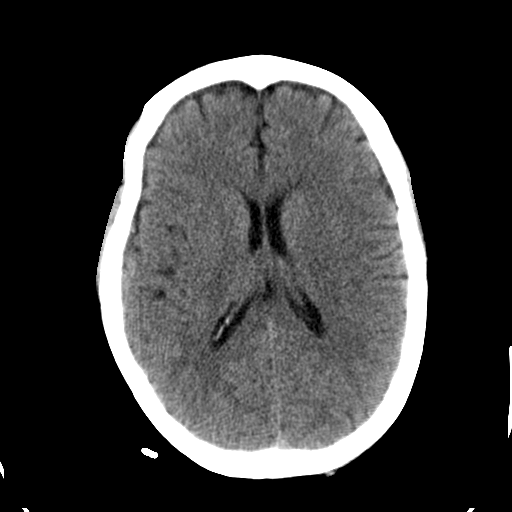
[im 23/45  brain]
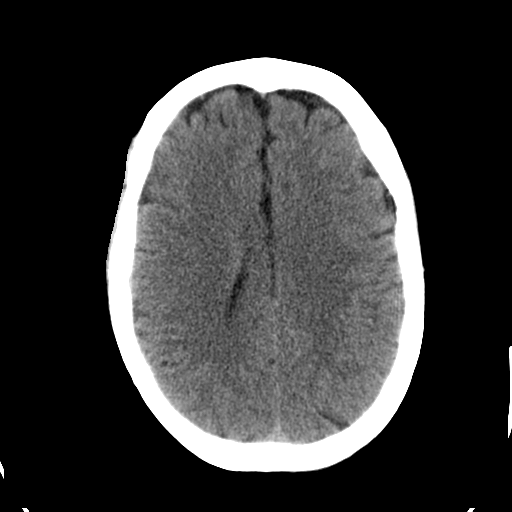
[im 26/45  brain]
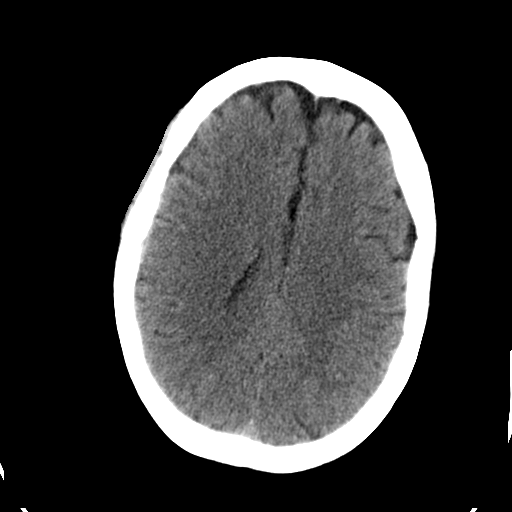
[im 29/45  brain]
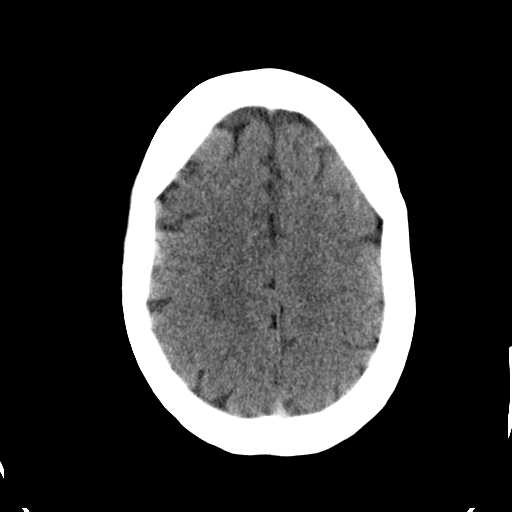
[im 29/45  bone]
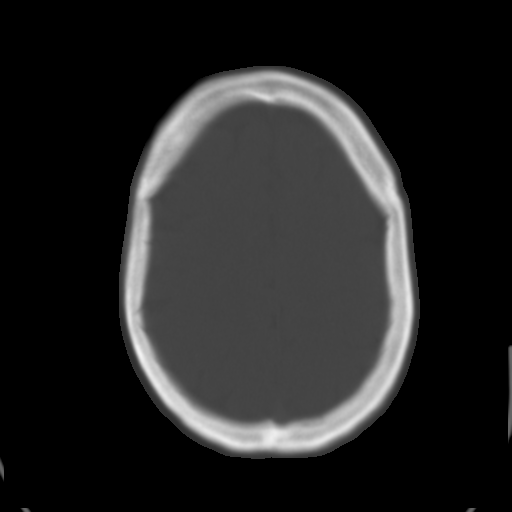
[im 32/45  brain]
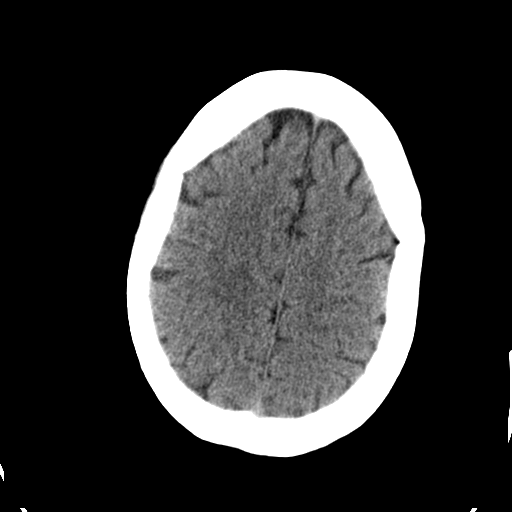
[im 35/45  brain]
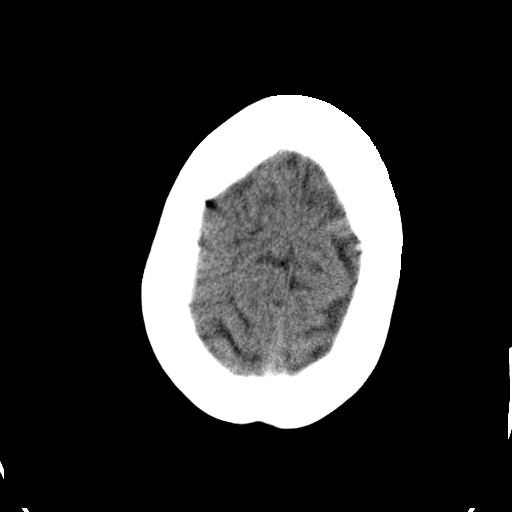
[im 38/45  brain]
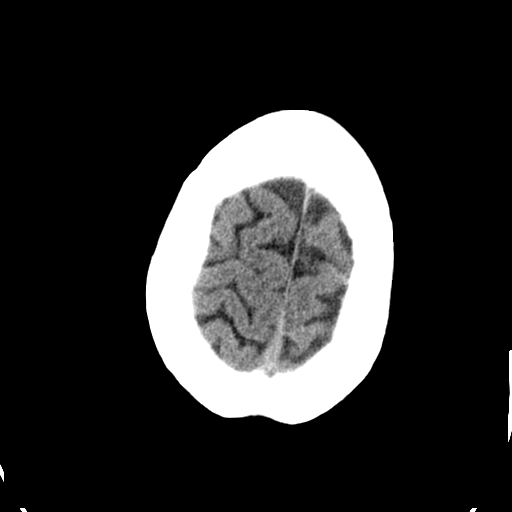
[im 41/45  brain]
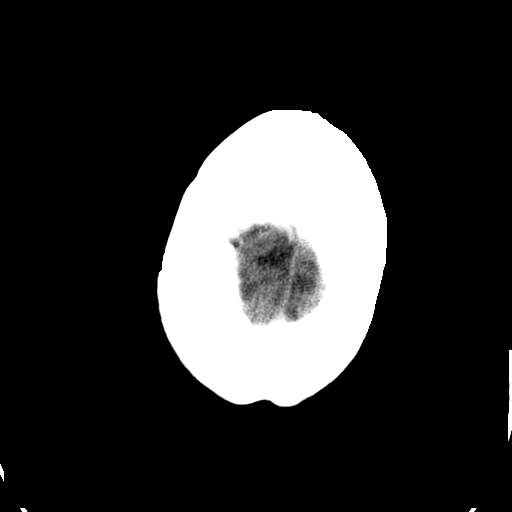
[im 41/45  bone]
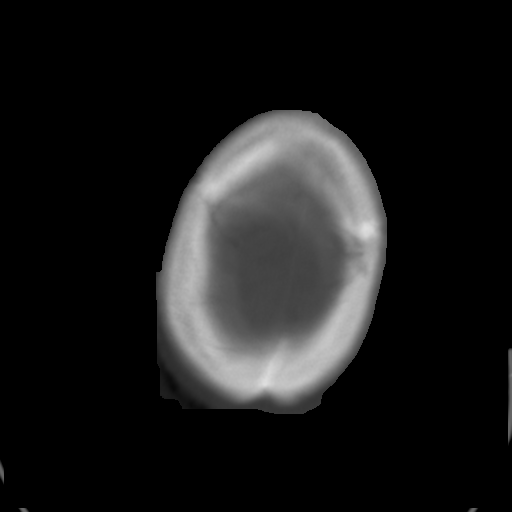

[Series 3: bone · axial · 0.42mm/px · z∈[-152,-122]mm · 2 of 45 slices shown]
[im 4/45  bone]
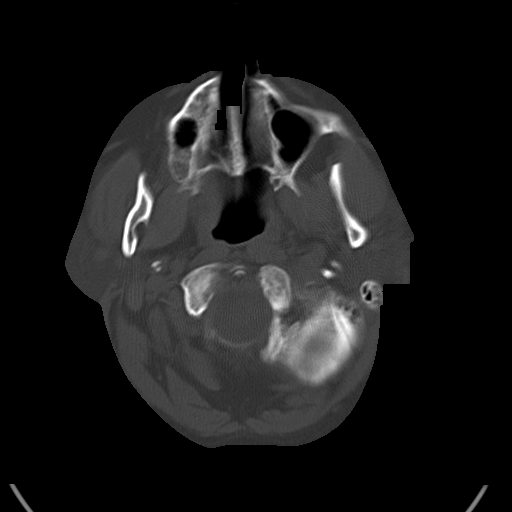
[im 10/45  bone]
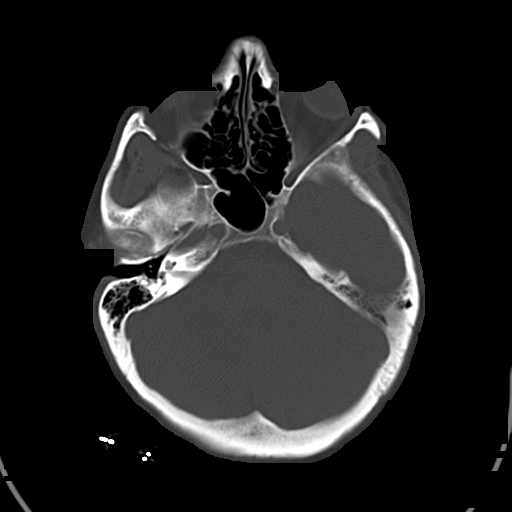

[15 of 30 positions shown; findings below may reference images not displayed]

FINDINGS: There is patient motion near the vertex of the brain limiting evaluation.

There is no evidence of mass effect, midline shift, or extra-axial fluid
collections.  There is no evidence of a space-occupying lesion or
intracranial hemorrhage. There is no evidence of a cortical-based area of
acute infarction.

The ventricles and sulci are appropriate for the patient's age. The basal
cisterns are patent.

Visualized portions of the orbits are unremarkable. The visualized portions
of the paranasal sinuses and mastoid air cells are unremarkable.

The osseous structures are unremarkable.
IMPRESSION: No acute intracranial process.

[REDACTED]

## 2012-07-21 ENCOUNTER — Emergency Department: Payer: Self-pay | Admitting: Emergency Medicine

## 2012-08-21 IMAGING — CR DG ANKLE COMPLETE 3+V*L*
1 series · 5 of 5 positions shown · non-contrast
Comparison: none

REASON FOR EXAM: injury
COMMENTS:   LMP: Post Hysterectomy

PROCEDURE:     DXR - DXR ANKLE LEFT COMPLETE  - December 25, 2011 [DATE]
RESULT:     Left ankle images demonstrate no evidence of fracture,
dislocation or foreign body. Soft tissue swelling is present especially
laterally. Spurring is seen in the plantar region of the calcaneus.

[Series 1: x ankle ap left · 0.14mm/px · 5 of 5 slices shown]
[im 1/5]
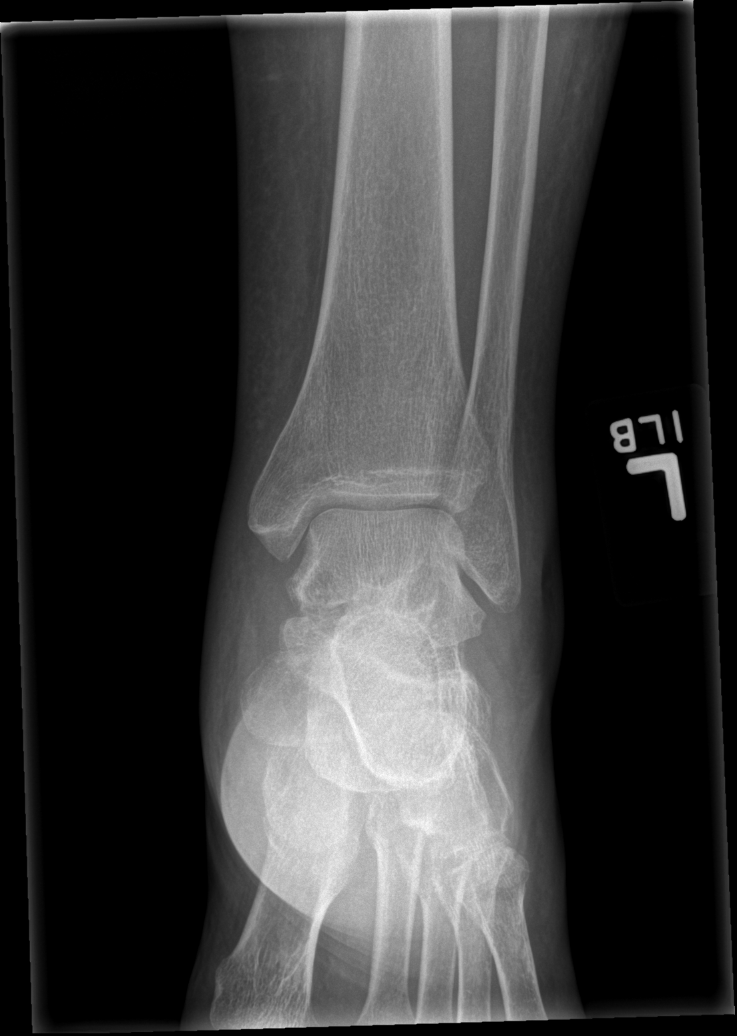
[im 2/5]
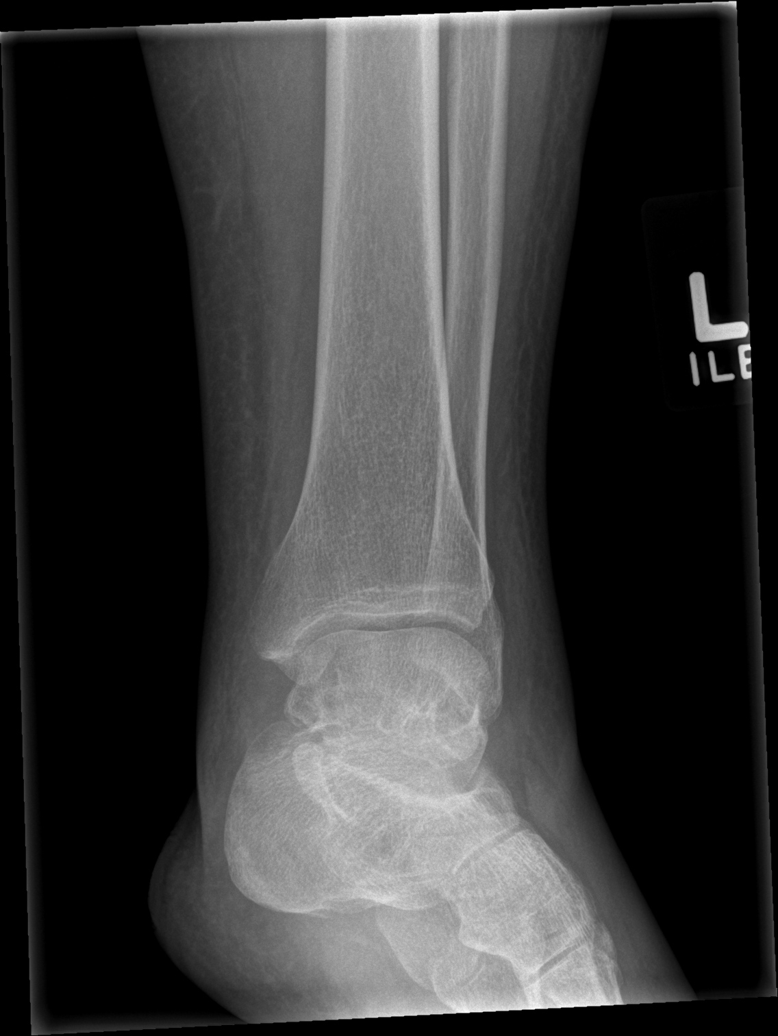
[im 3/5]
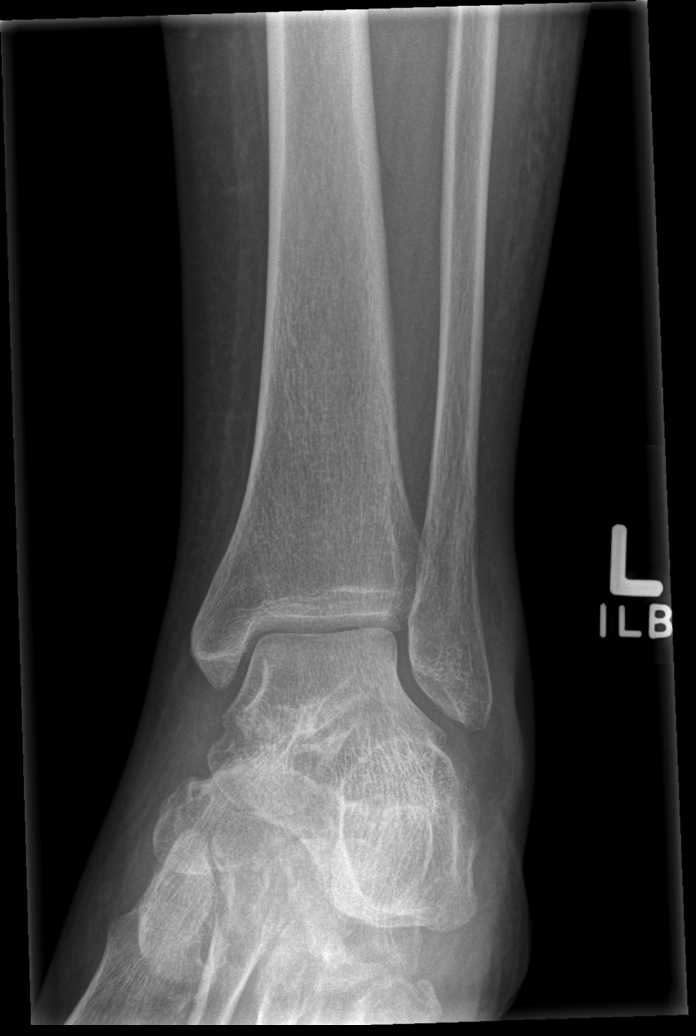
[im 4/5]
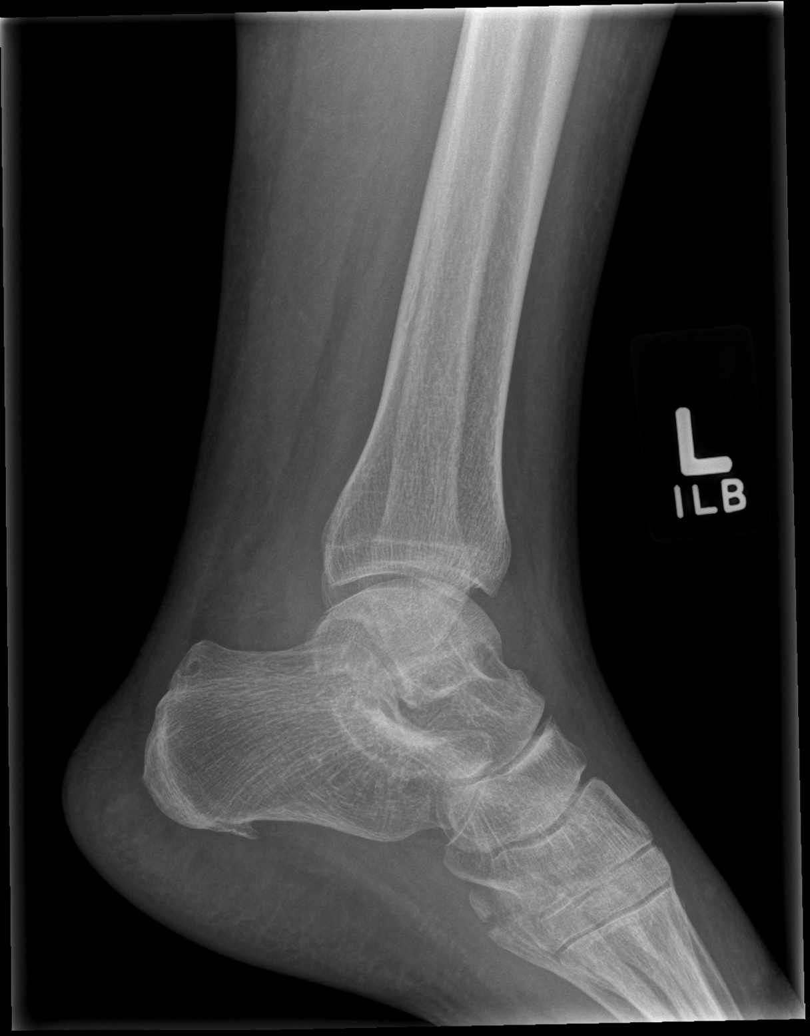
[im 5/5]
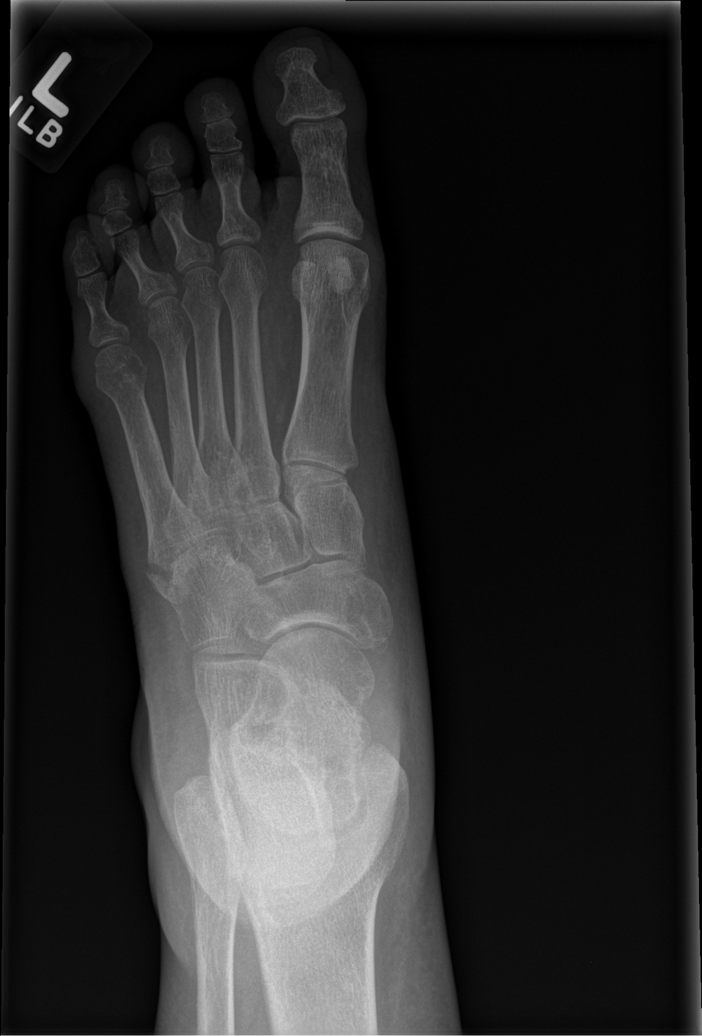

[5 of 5 positions shown; findings below may reference images not displayed]

IMPRESSION: No acute bony abnormality evident.

[REDACTED]

## 2014-09-04 NOTE — H&P (Signed)
PATIENT NAME:  Jaime Roberts, Jaime Roberts MR#:  [id number] DATE OF BIRTH:  07-08-1965  DATE OF ADMISSION:  09/11/2011  ADDENDUM   PHYSICAL EXAMINATION:   HEENT: Normocephalic, atraumatic. Pupils equally round and reactive to light and accommodation. Oropharynx clear with no erythema.   NECK: Supple. No masses. Nontender.   EXTREMITIES: No cyanosis, clubbing, or edema.   SKIN: Normal turgor. No rash.   LUNGS: Clear to auscultation with no wheezing, rhonchi, or rales.   CARDIOVASCULAR: Normal rate and rhythm. No murmurs, rubs, or gallops.   ABDOMEN: Bowel sounds positive. Soft, nontender, no rebound, no masses.   GENITOURINARY: Deferred.   NEUROLOGIC: Cranial nerves II through XII intact. Sensory is intact throughout to light touch. Motor is not touch tested throughout due to the patient's arthritic pain and her left upper extremity sling. Her coordination is grossly intact with gait. However, she does limp with complaint of pain to one side. She only is able to cooperate with checking of her reflexes in the upper right extremity which are normal. The other examination of reflexes is refused due to arthritic pain.  ____________________________ Adelene Amas. Jernee Murtaugh, MD jsw:ap D: 09/11/2011 23:41:47 ET          T: 09/12/2011 07:02:28 ET                JOB#: [id number] cc: Adelene Amas. Aaronmichael Brumbaugh, MD, <Dictator> Lester [place] MD ELECTRONICALLY SIGNED 09/19/2011 22:32

## 2014-09-04 NOTE — Consult Note (Signed)
Brief Consult Note: Diagnosis: Mood disorder NOS, benzodiazepine dependence.   Patient was seen by consultant.   Consult note dictated.   Recommend further assessment or treatment.   Orders entered.   Discussed with Attending MD.   Comments: Mr. Jaime Roberts has a h/o chronic pain and mood instability. She has been a patient of Dr. Gordy Roberts. She feels that she has been stable on medications that he prescribes and has no intention to make any changes. She was brought to the hospital after she was found asleep in a car at [facility] for presumed OD. She adamantly denies suicide attempt and explains that she felt tired after taking her regular medications after a sleepless night.   PLAN: 1. The patient no longer meets criteria for IVC. I will terminate proceedings. Please discharge as appropriate.  2. She is to continue Wellbutrin XL 150 mg. She does not wish to be referred to a psychiatrist.  3. She was advised on  danger of misuing benzodiazepines especially in combination with painkillers and muscle relaxants. She intends to continue on Xanax.  4. She was referred to our pain clinic in the past but is satisfied with the care she receives fron Dr. Gordy Roberts.  Electronic Signatures: Jaime Roberts (MD)  (Signed 23-Apr-13 09:59)  Authored: Brief Consult Note   Last Updated: 23-Apr-13 09:59 by Jaime Roberts (MD)

## 2014-09-04 NOTE — Discharge Summary (Signed)
PATIENT NAME:  Jaime Roberts, Jaime Roberts MR#:  [id number] DATE OF BIRTH:  1966/03/31  DATE OF ADMISSION:  09/11/2011 DATE OF DISCHARGE:  09/13/2011  HISTORY OF PRESENT ILLNESS: Jaime Roberts is a 49 year old female admitted to the [facility] after her second overdose within 10 days. She had been seen on the [facility] for an overdose within 10 days prior to admission. She was released at that time. During this episode, she was involuntarily committed and admitted to the [facility] for further evaluation and treatment.   From the moment she arrived to the Emergency Department she denied any thoughts of harming herself or others. She denied suicidal intent. She was not having any hallucinations or delusions. Her orientation and memory function were intact except for a very long gap in her memory regarding the period that involved overdose and approximately 12 hours afterwards.   She was undergoing treatment for chronic anxiety which included Xanax 2 mg b.i.d. She also had been taking medicine for depression, Wellbutrin 150 mg SR daily. She stated that the alprazolam was controlling her anxiety and depression had not returned.   It was also noted that she was listing these other medications on her admission medication list: Fioricet with butalbital which was given at a rate p.r.n.  which was unclear. She also was taking Soma 350 mg q.i.d. and gabapentin 800 mg t.i.d.    ANCILLARY CLINICAL DATA: None.   HOSPITAL COURSE: The patient was admitted to the [facility] and underwent milieu and group psychotherapy. Her behavior, emotions and cognition, as well as memory, were observed over the time of admission; and it was determined that she was not committable. She met no criteria for mandatory hospitalization, and she wanted to leave on Mary 3rd.   During the hospitalization the undersigned made every effort to educate the patient regarding how the  combinations of her medications could result in amnesia and anterograde amnesia that would prevent her from knowing how much medication she had taken during any given period of the day for any given somatic symptoms.  This process could result in a nonintentional and yet lethal overdose.  Therefore, the patient did concur with cutting her Soma dosage in half and the attempt to eliminate butalbital from her  pain treatment.  Naprosyn was substituted at 500 mg b.i.d. She also was placed on an Ativan taper to eliminate the Ativan from her regimen. Her bupropion was continued at 150 mg SR b.i.d. for antidepression. There was no change in her gabapentin.   Given her two very recent overdoses which could have been lethal, her Xanax bottle was destroyed upon discharge.   CONDITION ON DISCHARGE: On May 3rd, Jaime Roberts is alert. Her eye contact is good. Her concentration is normal. She is oriented to all spheres. Her memory is intact to immediate, recent, and remote except for the blackout period regarding the overdose. Her speech involves normal rate and prosody. Her intelligence, fund of knowledge and use of language are normal. Thought process is logical, coherent, and goal directed. Thought content: No thoughts of harming herself or others. No delusions or hallucinations. Affect is broad and appropriate. Mood is within normal limits. Insight is intact. Judgment is intact.   DISCHARGE DIAGNOSES:  AXIS I:  1. Major depressive disorder, recurrent, in clinical remission.  2. Anxiety disorder, not otherwise specified, stable.   AXIS II: Deferred.   AXIS III:  Rheumatoid arthritis. Please see the admission History and Physical.  AXIS IV: Primary support group, general medical.   AXIS V: 30.   Jaime Roberts is not at risk to harm herself or others. She agrees to call Emergency Services immediately for any thoughts of harming herself, thoughts of harming others, or distress. She agrees to not drive if drowsy.    She concurs with the ongoing caution about her medication regimen and the synergizing of agents that can be sedating and causing amnesia.    DIET: Diet is regular.   ACTIVITY: Routine.  DISCHARGE MEDICATIONS:  1. Menest 0.625 mg daily. 2. Klor-Con 10 mEq daily. 3. Bupropion 150 mg SR b.i.d.  4. Carisoprodol 350 mg, 1/2 tab every 6 hours p.r.n. pain.  5. Lasix 20 mg daily. 6. Prednisone 10 mg daily. 7. Zolpidem 10 mg at bedtime p.r.n. insomnia.  8. Promethazine 25 mg every 6 hours p.r.n. nausea. 9. Maxalt 10 mg daily p.r.n. headache. 10. Hydroxyzine 25 mg, 1 every 6 hours p.r.n. anxiety.  11. Gabapentin 800 mg t.i.d.   FOLLOW UP:  1. Follow-up appointment with [facility] with Dr. Priscille Kluver on 09/17/2011 at 12:20 p.m.  2. She also was given the directions for setting up a follow-up appointment as recommended by the orthopedic surgeon to assess her left extremity. She is to call [phone] to arrange a followup with Dr. Christophe Louis.   ____________________________ Drue Stager. Jessie Cowher, MD jsw:cbb D: 09/25/2011 11:29:58 ET T: 09/25/2011 12:49:06 ET JOB#: [id number]  cc: Drue Stager. Marvie Calender, MD, <Dictator> Billie Ruddy MD ELECTRONICALLY SIGNED 09/25/2011 19:05

## 2014-09-04 NOTE — Consult Note (Signed)
Brief Consult Note: Diagnosis: polydrug overdose.   Patient was seen by consultant.   Consult note dictated.   Discussed with Attending MD.   Comments: Psychiatry: Patient seen. Patient brought in unconcious due to overuse of multiple prescription drugs. Now awake, alert and totally denies any suicidal or homicidal ideation. PAtient not reporting depression and not psychotic or delirious. I have educsated her about the dangerof her high doses of meds and pointed out the obvious recurent danger they have caused. I have offered her admission for detox but she refuses. No longer commitable. Will discharge commitment and have discussed with ER doctor. Patient will follow up with her primary care doctor and rheumatologist.  Electronic Signatures: Maryfrances Portugal, Jackquline Denmark (MD)  (Signed 19-Apr-13 16:45)  Authored: Brief Consult Note   Last Updated: 19-Apr-13 16:45 by Audery Amel (MD)
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
## 2014-09-04 NOTE — H&P (Signed)
PATIENT NAME:  Jaime Roberts, Jaime Roberts MR#:  [id number] DATE OF BIRTH:  10-Jun-1965  DATE OF ADMISSION:  09/11/2011  CHIEF COMPLAINT AND IDENTIFYING DATA: Jaime Roberts is a 49 year old female presenting to the Emergency Department under involuntary commitment after taking an overdose.   HISTORY OF PRESENT ILLNESS: This is the second time in just over a week that Jaime Roberts patient has been brought in to the Emergency Department after an overdose. Jaime Roberts does have a history of depression with substance abuse and suicidal ideation. Jaime Roberts continues to deny any intention of harming herself. Jaime Roberts denies thoughts of harming others. Jaime Roberts has no hallucinations or delusions. Her judgment; however, is impaired. Her insight into the danger of her pattern is poor. After recovering from sedation her orientation is intact. Jaime Roberts does have the ability to store and recall memory.   Jaime Roberts has been taking her antidepressant regimen, however, it is unclear whether Jaime Roberts has been able to keep up with the right dosage given that Jaime Roberts has not been alert.   PAST PSYCHIATRIC HISTORY: In review of the past medical record, on 04/19 Dr. Toni Amend from [facility] saw the patient on the general medical ward after an overdose and public intoxication and delirium. The patient had been found wondering around confused and asking about more pain medicine. Jaime Roberts was brought into the Emergency Room obtunded. It was noted at that time that Jaime Roberts had had a number of Emergency Room visits for the same type of problem. It was noted at that time that Jaime Roberts did not have insight into her pattern; a pattern of repeated overdose that could be lethal. It was also noted that Jaime Roberts was taking a combination of medications that can result in the side effects mentioned below. Please see the medications on admission as well as discussion.   At the time of 04/19 the patient was offered an admission to the inpatient behavioral health unit, however, Jaime Roberts declined and Jaime Roberts was not committable.  Her  last inpatient behavioral health admission was in August 2010. At that time Jaime Roberts overdosed on Xanax. Jaime Roberts was unable to give a full interview at the time of admission. Jaime Roberts did give a history of severe depression. The patient was discharged on Celexa 40 mg daily, trazodone 100 mg at bedtime.   Most recently her antidepressant has been Wellbutrin; please see below.   The patient does not have a history of elevated mood, decreased need for sleep or racing thoughts. Jaime Roberts has no history of hallucinations or delusions when Jaime Roberts is not delirious on substance.   FAMILY PSYCHIATRIC HISTORY: Her mother was a schizophrenic.   SOCIAL HISTORY: Jaime Roberts does have a history of drinking 1 to 2 beers 2 to 3 times a week. This was in 2010. Jaime Roberts has no history of illegal drugs.   Education-9th grade. Divorced three times. No children. No history of abuse.   PAST MEDICAL HISTORY:  1. Rheumatoid arthritis.  2. Possible hepatitis C.  3. Chronic obstructive pulmonary disease.   ALLERGIES: Darvocet, penicillin, prednisolone.   MEDICATIONS UPON ADMISSION:  1. Menest 0.625 mg daily.  2. Alprazolam 2 mg q.i.d.  3. APAP/butalbital/caffeine 1 q.i.d. as needed. 4. Klor-Con 10, 1 daily.  5. Promethazine 25 mg q.i.d. p.r.n.  6. Ambien 10 mg at bedtime p.r.n. 7. Soma 350 mg q.i.d.  8. Vistaril 25 mg q.i.d.  9. Bupropion 150 mg SR daily. 10. Linzess 145 mcg daily. 11. Gabapentin 800 mg t.i.d.   LABORATORY, DIAGNOSTIC AND RADIOLOGICAL DATA: Head CT without contrast: No  acute intracranial process. Urine drug screen positive for benzodiazepines, positive for barbiturates, positive for MDMA. TSH normal. Aspirin unremarkable. CBC: Slight elevation of WBC. Ethanol negative. SGOT and SGPT normal. Albumin and total protein, bilirubin normal. BUN and creatinine unremarkable. Tylenol negative.  REVIEW OF SYSTEMS: Constitutional, head, eyes, ears, nose, throat, mouth, neurologic, psychiatric, cardiovascular, respiratory,  gastrointestinal, genitourinary, skin, musculoskeletal, hematologic, lymphatic, endocrine, metabolic all unremarkable.   PHYSICAL EXAMINATION: The examination was conducted with a female staff member of the Emergency Department.   VITAL SIGNS: Temperature 97.3, pulse 96, respiratory rate 18, blood pressure 145/89.   GENERAL APPEARANCE: Jaime Roberts is a well developed, well nourished middle age female lying in a supine position in her hospital bed with no abnormal involuntary movements. Jaime Roberts has normal muscle tone. Her grooming and hygiene are normal. Jaime Roberts does have a sling on her left upper extremity where Jaime Roberts was bruised due to a fall in her shower. Jaime Roberts has undergone medical work-up for this and is cleared for psychiatric admission.   Jaime Roberts is alert. Her eye contact is good. Her concentration is mildly decreased. Jaime Roberts is oriented to all spheres. Her memory is currently intact for retaining and recalling information, however, Jaime Roberts has gaps in her memory regarding the overdose. Her fund of knowledge, intelligence, and use of language are normal. Her speech is mildly flat. Her speech shows mildly flat prosody. There is no dysarthria. Thought process is coherent and logical. No tangents or looseness of associations. Thought content: No thoughts of harming herself or others. No delusions or hallucinations, however, her insight and judgment are poor. Affect is flat. Mood is anxious.   ASSESSMENT:  AXIS I:  1. Major depressive disorder, recurrent, severe. It is unclear whether the overdoses are intentional. If they are then the patient's underlying depression is more severe than is currently evident, however, given the patient's medication list it is very possible that Jaime Roberts is chronically at a level of toxic influence that can fit into an anterograde amnesia very easily. If that takes place then a patient can continue to take medication dosages without remembering that they have done so. Therefore, to reduce her risk  of morbidity and mortality Jaime Roberts will need to undergo the plan as below.   2. Anxiety disorder, not otherwise specified. This is a provisional diagnosis given based on the benzodiazepine dosage.   AXIS II: Deferred.   AXIS III: Rheumatoid arthritis. Please see the past medical history.   AXIS IV: Primary support group, general medical.   AXIS V: 30.  Jaime Roberts has demonstrated an ongoing pattern that may result in her death. Jaime Roberts has demonstrated that Jaime Roberts has poor insight and judgment regarding this pattern. Jaime Roberts requires further evaluation and treatment to eliminate this risk.    RECOMMENDATIONS:  1. Admit Jaime Roberts to the inpatient behavioral health unit. 2. Ativan taper. 3. Soma reduction starting with cutting the dosage in half.  4. Eliminating butalbital from her pain treatment and substituting Naprosyn 500 mg b.i.d. p.r.n.  5. The Ativan taper mentioned will substitute for alprazolam and allow detox. For anti-acute anxiety the hydroxyzine 25 mg 4 times daily p.r.n. will be continued.  6. Bupropion 150 SR b.i.d. will be continued for anti-depression with possibility of altering this regimen to include augmentation for substitution with a serotonergic agent; long-term treatment of anxiety.  7. The current gabapentin dosage can be continued given the reduction of other agents described above, however, Jaime Roberts will need to be monitored for consistent intact faculties of alertness and  memory except when appropriate sedation is needed at bedtime. Therefore if Jaime Roberts does have insomnia Ambien will be available 10 mg at bedtime p.r.n.  8. Jaime Roberts will undergo milieu and group psychotherapy.  9. If substance dependence psychologic component is assessed Jaime Roberts will need to be informed of tools to help her with those conditions.   ____________________________ Adelene Amas. Janille Draughon, MD jsw:cms D: 09/11/2011 22:48:52 ET T: 09/12/2011 06:33:14 ET JOB#: [id number]  cc: Adelene Amas. Marissah Vandemark, MD, <Dictator> Lester Plainview MD ELECTRONICALLY SIGNED 09/19/2011 22:32

## 2014-09-04 NOTE — Consult Note (Signed)
PATIENT NAME:  Jaime Roberts, Jaime Roberts MR#:  [id number] DATE OF BIRTH:  1965/08/31  DATE OF CONSULTATION:  08/30/2011  REFERRING PHYSICIAN:   CONSULTING PHYSICIAN:  Audery Amel, MD  IDENTIFYING INFORMATION AND REASON FOR CONSULT: This is a 49 year old woman brought into the emergency room by law enforcement after being found passed out unconscious and apparently delirious in public. Consult for evaluation of mental status changes.   CHIEF COMPLAINT: "I know that I showed my ass last night."   HISTORY OF PRESENT ILLNESS: The patient was found by law enforcement wandering around looking confused and unsteady talking about needing more pain medicine. She became unconscious. When evaluated in the emergency room last night she was described as being obtunded. The patient has now woken up at the time of my evaluation. She tells me that she has just been taking her normal prescription medicine the way she usually does. That includes alprazolam 2 mg four times a day, Fioricet used on a p.r.n. basis, Soma used on a frequent p.r.n. basis, zolpidem at night, and p.r.n. tramadol, as well as gabapentin 800 mg three times a day. She says that her mistake was taking her zolpidem on top of her other medicine and not going immediately to bed. She denies that her mood is depressed. She totally denies any suicidal ideation or homicidal ideation. She totally denies that she was trying to harm herself or trying to overdose on medicine. The patient denies feeling like she has any kind of psychiatric problems. She denies auditory or visual hallucinations. She denies severely depressed mood. She denies anhedonia. She claims that she is at her baseline in terms of function. She is entirely focused on her somatic complaints. She reports that she has chronic pain all over her body which is due to a diagnosis of fibromyalgia and rheumatoid arthritis. She has been maintained on the combination of medicines that she is taking now by her  outpatient doctor. She does not have any insight into it being a problem. She denies that she has been drinking or abusing any other drugs or getting any extra drugs off the street.   PAST PSYCHIATRIC HISTORY: The patient has had several emergency rooms stays for essentially the same problem. Accidental overdose related to her prescription medicines. She has been brought into the emergency room previously in a delirious condition and took hours to clear up, but when she did had no insight and would not agree that it was representing a problem. She has been educated multiple times about the dangers of her medication, but continues to insist that she does not believe she has a problem. She denies that she has ever actually tried to kill herself in the past. She says she has been treated for depression but denies any suicidal behavior.   PAST MEDICAL HISTORY: The patient has been apparently diagnosed with rheumatoid arthritis and fibromyalgia in the past. She has hepatitis C. According to her, her doctors have told her that because of her liver she is unable to take most medicines that would treat her rheumatoid arthritis and instead she is maintained on the combination of controlled substances that she takes.   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   CURRENT MEDICATIONS:  1. Gabapentin 800 mg three times daily. 2. Alprazolam 2 mg four times daily. 3. Fioricet p.r.n.  4. Tramadol p.r.n. 5. Soma 350 mg p.r.n., probably 3 to 4 times a day. 6. Wellbutrin, unknown dose.  7. Some kind of "fluid pill", probably Lasix. 8. Potassium.   ALLERGIES: Allegedly Darvocet, penicillin, and prednisolone.  REVIEW OF SYSTEMS: The patient is complaining of feeling pain all over her body, especially in all of her joints. Denies depression. Denies suicidal ideation. Denies psychotic symptoms.   MENTAL STATUS EXAMINATION: Alert and oriented. Cooperative with the  interview. Good eye contact. Normal psychomotor activity. Speech is normal rate, tone, and volume. Affect is reactive, slightly irritable but not aggressive or hostile. Thoughts are lucid. She has no insight into her substance use, but does not show any sign of bizarre or delusional thinking. Totally denies any suicidal or homicidal ideation. Denies any hallucinations, visual or auditory. Judgment and insight chronically impaired.   PHYSICAL EXAMINATION: A full physical is not indicated for consultation, but the patient does have vitals signs done which currently show a blood pressure of 95/67, respirations 20, pulse 94, and temperature 97.3. She has a normal gait. Appears to have fairly normal range of motion throughout. The joints that I could observe did not appear to be swollen or hot.   CURRENT LABORATORY RESULTS: Drug screen is positive for tricyclics, MDMA, barbiturates, and benzodiazepines. TSH normal. Alcohol undetectable. Chemistry panel is actually pretty normal with only a slightly low albumin at 3.2, but no other changes in any of the liver indices.   ASSESSMENT: A 49 year old woman who was brought in obtunded but now has detox. She is not delirious and not reporting depression. Totally denies suicidal or homicidal ideation. Denies any psychotic symptoms. She has been educated at some length about the dangers of her current medicines. I have educated her that her current dose of Xanax exceeds the normally recommended dose, that it is very dangerous to combine benzodiazepines with barbiturates, that it is dangerous to combine both of them with soma and also with tramadol. I have told her that she is at risk for dying by either direct result of this combination of medicine or by some complications such as an aspiration pneumonia. I have recommended to her that we admit her to the hospital for detoxification. At this point, the patient is not acutely a threat to herself or others and is no longer  meeting commitment criteria.   TREATMENT PLAN: The patient is not interested in being admitted to the hospital. She listened to my explanation and expresses a basic understanding of it, but says that she prefers to continue all of her current medications and follow up with her primary care doctor. I have discussed the case with the emergency room doctor. She has no longer committable. We will release her from emergency room care at this time after having educated her about the risks she is running.   DIAGNOSIS PRINCIPLE AND PRIMARY:   AXIS I: Acute intoxication on multiple drugs including benzodiazepines and barbiturates.   SECONDARY DIAGNOSES:   AXIS I: Polysubstance dependence.   AXIS II: Deferred.   AXIS III: Chronic pain, hepatitis C.   AXIS IV: Severe - Chronic stress from pain and illness and lack of resources.   AXIS V: Functioning at time of discharge and evaluation 57.  ____________________________ Audery Amel, MD jtc:slb D: 08/30/2011 16:54:35 ET T: 08/30/2011 17:31:14 ET JOB#: [id number]  cc: Audery Amel, MD, <Dictator> Audery Amel MD ELECTRONICALLY SIGNED 09/02/2011 9:42

## 2014-09-04 NOTE — Consult Note (Signed)
49 year old female referred for left shoulder fracture. and xrays reviewed. Patient may be treated with a shoulder immobilizer or a sling. This may be removed for bathing and for range of motion of the elbow. She does not need surgery or orthopedic intervention. up in my office 7-10 days after discharge and repeat xray will be taken here. This fracture should heal quite well with no permanent impairment.  Electronic Signatures: Clare Gandy (MD)  (Signed on 02-May-13 20:03)  Authored  Last Updated: 02-May-13 20:03 by Clare Gandy (MD)

## 2014-12-20 ENCOUNTER — Emergency Department
Admission: EM | Admit: 2014-12-20 | Discharge: 2014-12-20 | Disposition: A | Discharge status: Home / Self Care | Payer: Medicaid Other | Attending: Emergency Medicine | Admitting: Emergency Medicine

## 2014-12-20 DIAGNOSIS — R21 Rash and other nonspecific skin eruption: Secondary | ICD-10-CM | POA: Insufficient documentation

## 2014-12-20 DIAGNOSIS — H9209 Otalgia, unspecified ear: Secondary | ICD-10-CM | POA: Insufficient documentation

## 2014-12-20 DIAGNOSIS — Z720 Tobacco use: Secondary | ICD-10-CM | POA: Diagnosis not present

## 2014-12-20 DIAGNOSIS — Z880 Allergy status to penicillin: Secondary | ICD-10-CM | POA: Insufficient documentation

## 2014-12-20 DIAGNOSIS — L989 Disorder of the skin and subcutaneous tissue, unspecified: Secondary | ICD-10-CM | POA: Diagnosis present

## 2014-12-20 MED ORDER — OXYCODONE-ACETAMINOPHEN 5-325 MG PO TABS
2.0000 | ORAL_TABLET | Freq: Once | ORAL | Status: AC
  Administered 2014-12-20: 2 via ORAL
  Filled 2014-12-20: qty 2

## 2014-12-20 MED ORDER — SULFAMETHOXAZOLE-TRIMETHOPRIM 800-160 MG PO TABS: 1.0000 | ORAL_TABLET | Freq: Two times a day (BID) | ORAL | Status: DC

## 2014-12-20 MED ORDER — BUTALBITAL-APAP-CAFFEINE 50-325-40 MG PO TABS: 1.0000 | ORAL_TABLET | Freq: Four times a day (QID) | ORAL | Status: AC | PRN

## 2014-12-20 MED ORDER — SULFAMETHOXAZOLE-TRIMETHOPRIM 800-160 MG PO TABS
1.0000 | ORAL_TABLET | Freq: Once | ORAL | Status: AC
  Administered 2014-12-20: 1 via ORAL
  Filled 2014-12-20: qty 1

## 2014-12-20 MED ORDER — KETOROLAC TROMETHAMINE 60 MG/2ML IM SOLN
60.0000 mg | Freq: Once | INTRAMUSCULAR | Status: AC
  Administered 2014-12-20: 60 mg via INTRAMUSCULAR
  Filled 2014-12-20: qty 2

## 2014-12-20 MED ORDER — MUPIROCIN 2 % EX OINT: TOPICAL_OINTMENT | CUTANEOUS | Status: AC

## 2014-12-20 MED ORDER — CLINDAMYCIN HCL 300 MG PO CAPS: 300.0000 mg | ORAL_CAPSULE | Freq: Three times a day (TID) | ORAL | Status: DC

## 2014-12-20 NOTE — Discharge Instructions (Signed)
Drug Rash Skin reactions can be caused by several different drugs. Allergy to the medicine can cause itching, hives, and other rashes. Sun exposure causes a red rash with some medicines. Mononucleosis virus can cause a similar red rash when you are taking antibiotics. Sometimes, the rash may be accompanied by pain. The drug rash may happen with new drugs or with medicines that you have been taking for a while. The rash cannot be spread from person to person. In most cases, the symptoms of a drug rash are gone within a few days of stopping the medicine. Your rash, including hives (urticaria), is most likely from the following medicines:  Antibiotics or antimicrobials.  Anticonvulsants or seizure medicines.  Antihypertensives or blood pressure medicines.  Antimalarials.  Antidepressants or depression medicines.  Antianxiety drugs.  Diuretics or water pills.  Nonsteroidal anti-inflammatory drugs.  Simvastatin.  Lithium.  Omeprazole.  Allopurinol.  Pseudoephedrine.  Amiodarone.  Packed red blood cells, when you get a blood transfusion.  Contrast media, such as when getting an imaging test (CT or CAT scan). This drug list is not all inclusive, but drug rashes have been reported with all the medicines listed above. Your caregiver will tell you which medicines to avoid. If you react to a medicine, a similar or worse reaction can occur the next time you take it. If you need to stop taking an antibiotic because of a drug rash, an alternative antibiotic may be needed to get rid of your infection. Antihistamine or cortisone drugs may be prescribed to help relieve your symptoms. Stay out of the sun until the rash is completely gone.  Be sure to let your caregiver know about your drug reaction. Do not take this medicine in the future. Call your caregiver if your drug rash does not improve within 3 to 4 days. SEEK IMMEDIATE MEDICAL CARE IF:   You develop breathing problems, swelling in the  throat, or wheezing.  You have weakness, fainting, fever, and muscle or joint pains.  You develop blisters or peeling of skin, especially around the mouth. Document Released: 06/06/2004 Document Revised: 09/13/2013 Document Reviewed: 03/17/2008 Exit[employer] Patient Information 2015 [employer]. This information is not intended to replace advice given to you by your health care provider. Make sure you discuss any questions you have with your health care provider.  

## 2014-12-20 NOTE — ED Notes (Signed)
Ear pain started hurting about a week ago and has several places on the head that are irritated.

## 2014-12-20 NOTE — ED Provider Notes (Signed)
[facility] Emergency Department Provider Note  ____________________________________________  Time seen: Approximately 7:22 PM  I have reviewed the triage vital signs and the nursing notes.   HISTORY  Chief Complaint Otalgia   HPI Jaime Roberts is a 49 y.o. female presents for evaluation of sores all over her body and her pain. Patient states that source of started after increasing doses of morphine from her pain management clinic. Patient denies any other complaints. No chest pain shortness of breath. Denies any fever or dizziness. Nursing history reviewed.   Past Medical History  Diagnosis Date  . CHF (congestive heart failure)     There are no active problems to display for this patient.   Past Surgical History  Procedure Laterality Date  . Abdominal hysterectomy      Current Outpatient Rx  Name  Route  Sig  Dispense  Refill  . butalbital-acetaminophen-caffeine (FIORICET) 50-325-40 MG per tablet   Oral   Take 1-2 tablets by mouth every 6 (six) hours as needed for headache.   20 tablet   0   . clindamycin (CLEOCIN) 300 MG capsule   Oral   Take 1 capsule (300 mg total) by mouth 3 (three) times daily.   30 capsule   0   . mupirocin ointment (BACTROBAN) 2 %      Apply to affected area 3 times daily   22 g   0   . sulfamethoxazole-trimethoprim (BACTRIM DS,SEPTRA DS) 800-160 MG per tablet   Oral   Take 1 tablet by mouth 2 (two) times daily.   20 tablet   0     Allergies Penicillins  No family history on file.  Social History History  Substance Use Topics  . Smoking status: Current Every Day Smoker -- 1.00 packs/day    Types: Cigarettes  . Smokeless tobacco: Never Used  . Alcohol Use: Yes    Review of Systems Constitutional: No fever/chills Eyes: No visual changes. ENT: No sore throat. Cardiovascular: Denies chest pain. Respiratory: Denies shortness of breath. Gastrointestinal: No abdominal pain.  No nausea, no  vomiting.  No diarrhea.  No constipation. Genitourinary: Negative for dysuria. Musculoskeletal: Negative for back pain. Skin: Positive for multiple skin ulcerations/lesions. Neurological: Negative for headaches, focal weakness or numbness.  10-point ROS otherwise negative.  ____________________________________________   PHYSICAL EXAM:  VITAL SIGNS: ED Triage Vitals  Enc Vitals Group     BP 12/20/14 1852 122/81 mmHg     Pulse Rate 12/20/14 1852 76     Resp 12/20/14 1852 20     Temp 12/20/14 1852 98.2 F (36.8 C)     Temp Source 12/20/14 1852 Oral     SpO2 12/20/14 1852 95 %     Weight 12/20/14 1852 140 lb (63.504 kg)     Height 12/20/14 1852 5\' 2"  (1.575 m)     Head Cir --      Peak Flow --      Pain Score 12/20/14 1852 7     Pain Loc --      Pain Edu? --      Excl. in GC? --     Constitutional: Alert and oriented. Well appearing and in no acute distress. Eyes: Conjunctivae are normal. PERRL. EOMI. Head: Atraumatic. Nose: No congestion/rhinnorhea. Mouth/Throat: Mucous membranes are moist.  Oropharynx non-erythematous. Neck: No stridor.   Cardiovascular: Normal rate, regular rhythm. Grossly normal heart sounds.  Good peripheral circulation. Respiratory: Normal respiratory effort.  No retractions. Lungs CTAB. Musculoskeletal: No lower extremity  tenderness nor edema.  No joint effusions. Neurologic:  Normal speech and language. No gross focal neurologic deficits are appreciated. No gait instability. Skin:  Skin is warm, dry and intact. No areas of excoriations and lesions noted on the scalp neck outer right ear. Also noted on the arms and abdomen. Psychiatric: Mood and affect are normal. Speech and behavior are normal.  ____________________________________________   LABS (all labs ordered are listed, but only abnormal results are displayed)  Labs Reviewed - No data to display ____________________________________________     PROCEDURES  Procedure(s) performed:  None  Critical Care performed: No  ____________________________________________   INITIAL IMPRESSION / ASSESSMENT AND PLAN / ED COURSE  Pertinent labs & imaging results that were available during my care of the patient were reviewed by me and considered in my medical decision making (see chart for details).  Multiple drug excoriations sores. Rx given for Bactrim DS twice a day, Bactroban ointment, clindamycin 300 mg 3 times a day, and Fioricet as needed for headache. Patient voices no other emergency medical complaints and will return to the ER with any worsening symptomology. He agrees to follow-up with pain management as scheduled. ____________________________________________   FINAL CLINICAL IMPRESSION(S) / ED DIAGNOSES  Final diagnoses:  Rash and nonspecific skin eruption      Evangeline Dakin, PA-C 12/20/14 2114  Sharyn Creamer, MD 12/20/14 2355

## 2014-12-20 NOTE — ED Notes (Signed)

## 2014-12-20 NOTE — ED Notes (Signed)
Patient present to ED with c/o bilateral ear pain and sores on head, buttocks, and abdomen that are itching and are painful, patient also reports neck pain, increased pain with movement. Patient reports right ear drainage, clear liquid with odor. Patient reports symptoms began 2 weeks ago and has progressively become worse. Patient denies chest pain, shortness of breath, fevers, or dizziness. Patient alert and oriented x 4, respirations even and unlabored. Call bell within reach.

## 2019-01-23 ENCOUNTER — Emergency Department
Admission: EM | Admit: 2019-01-23 | Discharge: 2019-01-23 | Disposition: A | Discharge status: Home / Self Care | Payer: Medicaid - Out of State | Attending: Emergency Medicine | Admitting: Emergency Medicine

## 2019-01-23 ENCOUNTER — Encounter: Payer: Self-pay | Admitting: Emergency Medicine

## 2019-01-23 ENCOUNTER — Other Ambulatory Visit: Payer: Self-pay

## 2019-01-23 DIAGNOSIS — M069 Rheumatoid arthritis, unspecified: Secondary | ICD-10-CM | POA: Diagnosis not present

## 2019-01-23 DIAGNOSIS — W19XXXA Unspecified fall, initial encounter: Secondary | ICD-10-CM

## 2019-01-23 DIAGNOSIS — Y999 Unspecified external cause status: Secondary | ICD-10-CM | POA: Insufficient documentation

## 2019-01-23 DIAGNOSIS — Y92009 Unspecified place in unspecified non-institutional (private) residence as the place of occurrence of the external cause: Secondary | ICD-10-CM

## 2019-01-23 DIAGNOSIS — M7918 Myalgia, other site: Secondary | ICD-10-CM | POA: Diagnosis not present

## 2019-01-23 DIAGNOSIS — Y9389 Activity, other specified: Secondary | ICD-10-CM | POA: Insufficient documentation

## 2019-01-23 DIAGNOSIS — Z79899 Other long term (current) drug therapy: Secondary | ICD-10-CM | POA: Diagnosis not present

## 2019-01-23 DIAGNOSIS — I509 Heart failure, unspecified: Secondary | ICD-10-CM | POA: Diagnosis not present

## 2019-01-23 DIAGNOSIS — F17210 Nicotine dependence, cigarettes, uncomplicated: Secondary | ICD-10-CM | POA: Insufficient documentation

## 2019-01-23 DIAGNOSIS — Y9259 Other trade areas as the place of occurrence of the external cause: Secondary | ICD-10-CM | POA: Diagnosis not present

## 2019-01-23 DIAGNOSIS — W010XXA Fall on same level from slipping, tripping and stumbling without subsequent striking against object, initial encounter: Secondary | ICD-10-CM | POA: Insufficient documentation

## 2019-01-23 MED ORDER — HYDROCODONE-ACETAMINOPHEN 5-325 MG PO TABS
1.0000 | ORAL_TABLET | Freq: Once | ORAL | Status: AC
  Administered 2019-01-23: 19:00:00 1 via ORAL
  Filled 2019-01-23: qty 1

## 2019-01-23 MED ORDER — HYDROCODONE-ACETAMINOPHEN 5-325 MG PO TABS: 1.0000 | ORAL_TABLET | Freq: Three times a day (TID) | ORAL | 0 refills | Status: AC | PRN

## 2019-01-23 MED ORDER — KETOROLAC TROMETHAMINE 30 MG/ML IJ SOLN
30.0000 mg | Freq: Once | INTRAMUSCULAR | Status: AC
  Administered 2019-01-23: 19:00:00 30 mg via INTRAMUSCULAR
  Filled 2019-01-23: qty 1

## 2019-01-23 MED ORDER — CYCLOBENZAPRINE HCL 5 MG PO TABS: 5.0000 mg | ORAL_TABLET | Freq: Three times a day (TID) | ORAL | 0 refills | Status: AC | PRN

## 2019-01-23 MED ORDER — ORPHENADRINE CITRATE 30 MG/ML IJ SOLN
60.0000 mg | INTRAMUSCULAR | Status: AC
  Administered 2019-01-23: 60 mg via INTRAMUSCULAR
  Filled 2019-01-23: qty 2

## 2019-01-23 NOTE — Discharge Instructions (Signed)
Take the prescription meds as directed. Follow-up with your provider or return as needed.  

## 2019-01-23 NOTE — ED Notes (Signed)
Patient ambulatory to lobby with steady gait and NAD noted. Verbalized understanding of discharge instructions and follow-up care.  

## 2019-01-23 NOTE — ED Notes (Signed)
Pt states that "someone" else was seen at a [facility] facility and has used her identity several times in this area. Pt also has bedbug bites to legs. Pt states she stayed somewhere that had bedbugs. Pt came via EMS.

## 2019-01-23 NOTE — ED Triage Notes (Signed)
States slipped in water 2 days ago and landed on back. States pain head, neck, back and legs. No LOC.

## 2019-01-24 NOTE — ED Provider Notes (Signed)
[facility] Emergency Department Provider Note ____________________________________________  Time seen: 1808  I have reviewed the triage vital signs and the nursing notes.  HISTORY  Chief Complaint  Fall  HPI Jaime Roberts is a 53 y.o. female presents to the ED for evaluation of injuries following mechanical fall.  Patient with history of severe rheumatoid arthritis, fibromyalgia, and tophaceous lesions, presents 2 days following a mechanical fall at the hotel where she and her boyfriend are staying. She scribes walking out of the hotel room, when she apparently slipped on a puddle outside, describes landing on her back.  Denies any significant head injury, loss of consciousness, nausea, vomiting, dizziness.  Her boyfriend had to help her get up, but she reports increased tenderness and stiffness throughout her joints following the fall.  He denies any bladder or bowel incontinence, foot drop, or distal paresthesias.  She reports generalized muscle stiffness and soreness, and is here for evaluation management of her pain.   Past Medical History:  Diagnosis Date  . CHF (congestive heart failure) (HCC)     There are no active problems to display for this patient.   Past Surgical History:  Procedure Laterality Date  . ABDOMINAL HYSTERECTOMY      Prior to Admission medications   Medication Sig Start Date End Date Taking? Authorizing Provider  gabapentin (NEURONTIN) 800 MG tablet Take 800 mg by mouth 3 (three) times daily.   Yes [provider]  predniSONE (DELTASONE) 5 MG tablet Take 5 mg by mouth daily with breakfast.   Yes [provider]  spironolactone (ALDACTONE) 25 MG tablet Take 25 mg by mouth daily.   Yes [provider]  cyclobenzaprine (FLEXERIL) 5 MG tablet Take 1 tablet (5 mg total) by mouth 3 (three) times daily as needed. 01/23/19   Colin Ellers, Charlesetta Ivory, PA-C  HYDROcodone-acetaminophen (NORCO) 5-325 MG tablet Take 1  tablet by mouth 3 (three) times daily as needed for up to 3 days. 01/23/19 01/26/19  Kimisha Eunice, Charlesetta Ivory, PA-C   Allergies Penicillins  No family history on file.  Social History Social History   Tobacco Use  . Smoking status: Current Every Day Smoker    Packs/day: 1.00    Types: Cigarettes  . Smokeless tobacco: Never Used  Substance Use Topics  . Alcohol use: Yes  . Drug use: No    Review of Systems  Constitutional: Negative for fever. Eyes: Negative for visual changes. ENT: Negative for sore throat. Cardiovascular: Negative for chest pain. Respiratory: Negative for shortness of breath. Gastrointestinal: Negative for abdominal pain, vomiting and diarrhea. Genitourinary: Negative for dysuria. Musculoskeletal: Positive for back pain. Skin: Negative for rash. Neurological: Negative for headaches, focal weakness or numbness. ____________________________________________  PHYSICAL EXAM:  VITAL SIGNS: ED Triage Vitals  Enc Vitals Group     BP 01/23/19 1719 117/72     Pulse Rate 01/23/19 1719 83     Resp 01/23/19 1719 18     Temp 01/23/19 1719 99.1 F (37.3 C)     Temp Source 01/23/19 1719 Oral     SpO2 01/23/19 1719 99 %     Weight 01/23/19 1721 140 lb (63.5 kg)     Height 01/23/19 1721 5\' 6"  (1.676 m)     Head Circumference --      Peak Flow --      Pain Score 01/23/19 1721 8     Pain Loc --      Pain Edu? --  Excl. in [place]? --     Constitutional: Alert and oriented. Well appearing and in no distress. GCS=15 Head: Normocephalic and atraumatic. Eyes: Conjunctivae are normal. PERRL. Normal extraocular movements Neck: Supple.  Normal range of motion without crepitus.  No distracting on tenderness is appreciated. Cardiovascular: Normal rate, regular rhythm. Normal distal pulses. Respiratory: Normal respiratory effort. No wheezes/rales/rhonchi. Gastrointestinal: Soft and nontender. No distention. Musculoskeletal: Spinal alignment without midline tenderness,  spasm, deformity, or step-off.  Patient with active range of motion of the upper extremities to baseline.  Normal composite fist bilaterally.  Normal rotator cuff resistance testing on exam.  Nontender with normal range of motion in all extremities.  Neurologic:  Normal gait without ataxia. Normal speech and language. No gross focal neurologic deficits are appreciated. Skin:  Skin is warm, dry and intact. No rash noted. ____________________________________________   RADIOLOGY  declined ____________________________________________  PROCEDURES  Toradol 30 mg IM Norflex 60 mg IM Norco 5-325 mg PO Procedures ____________________________________________  INITIAL IMPRESSION / ASSESSMENT AND PLAN / ED COURSE  She with ED evaluation of injury sustained following mechanical fall.  Patient clinical picture is consistent with myalgias and muscle strain following mechanical fall.  She reports her muscles are stiff, but denies any dysfunction above baseline.  Patient reports improvement of her pain and mobility following ED medication administration.  She is discharged at this time to follow-up with her primary providers back in [place] when she leaves town.  She is encouraged to return to the ED here as necessary.  A small prescription for hydrocodone as well as cyclobenzaprine is provided for her to take as needed.  Jaime Roberts was evaluated in Emergency Department on 01/24/2019 for the symptoms described in the history of present illness. She was evaluated in the context of the global COVID-19 pandemic, which necessitated consideration that the patient might be at risk for infection with the SARS-CoV-2 virus that causes COVID-19. Institutional protocols and algorithms that pertain to the evaluation of patients at risk for COVID-19 are in a state of rapid change based on information released by regulatory bodies including the CDC and federal and state organizations. These policies and algorithms were  followed during the patient's care in the ED.  I reviewed the patient's prescription history over the last 12 months in the multi-state controlled substances database(s) that includes [place], [place], [place], [place], [place], [place], [place], [place], [place], [place], [place], [place], [place], and [place].  Results were notable for routine gabapentin prescriptions noted.  ____________________________________________  FINAL CLINICAL IMPRESSION(S) / ED DIAGNOSES  Final diagnoses:  Fall in home, initial encounter  Musculoskeletal pain      Durant Scibilia, Dannielle Karvonen, PA-C 01/24/19 1519    Duffy Bruce, MD 01/26/19 [phone]

## 2019-05-14 DEATH — deceased
